# Patient Record
Sex: Male | Born: 1975 | Race: White | Hispanic: No | Marital: Married | Smoking: Never smoker
Health system: Southern US, Community
[De-identification: ages and names within clinical notes are randomized; demographics above are authoritative.]

## PROBLEM LIST (undated history)

## (undated) DIAGNOSIS — R011 Cardiac murmur, unspecified: Secondary | ICD-10-CM

## (undated) HISTORY — PX: COLONOSCOPY: SHX174

## (undated) HISTORY — DX: Cardiac murmur, unspecified: R01.1

---

## 2013-06-29 ENCOUNTER — Ambulatory Visit: Payer: Self-pay | Admitting: Gastroenterology

## 2016-12-07 ENCOUNTER — Ambulatory Visit (INDEPENDENT_AMBULATORY_CARE_PROVIDER_SITE_OTHER): Payer: Commercial Managed Care - PPO | Admitting: Family Medicine

## 2016-12-07 ENCOUNTER — Encounter: Payer: Self-pay | Admitting: Family Medicine

## 2016-12-07 VITALS — BP 140/92 | HR 87 | Temp 98.5°F | Resp 16 | Ht 66.5 in | Wt 272.2 lb

## 2016-12-07 DIAGNOSIS — Z0000 Encounter for general adult medical examination without abnormal findings: Secondary | ICD-10-CM

## 2016-12-07 DIAGNOSIS — R030 Elevated blood-pressure reading, without diagnosis of hypertension: Secondary | ICD-10-CM | POA: Diagnosis not present

## 2016-12-07 DIAGNOSIS — B372 Candidiasis of skin and nail: Secondary | ICD-10-CM | POA: Diagnosis not present

## 2016-12-07 DIAGNOSIS — Z23 Encounter for immunization: Secondary | ICD-10-CM

## 2016-12-07 DIAGNOSIS — Z6841 Body Mass Index (BMI) 40.0 and over, adult: Secondary | ICD-10-CM

## 2016-12-07 DIAGNOSIS — R5381 Other malaise: Secondary | ICD-10-CM

## 2016-12-07 DIAGNOSIS — R5383 Other fatigue: Secondary | ICD-10-CM

## 2016-12-07 NOTE — Progress Notes (Signed)
Patient: Jared Craig Male    DOB: 06/22/1975   41 y.o.   MRN: [id number] Visit Date: 12/07/2016  Today's Provider: Dortha Kern, PA   Chief Complaint  Patient presents with  . New Patient (Initial Visit)   Subjective:    HPI Jared Craig is a 41 year old male who presents today to [facility] as a new patient. He did not have a previous PCP after being released from [employer].   Past Medical History:  Diagnosis Date  . Heart murmur    as a child   Past Surgical History:  Procedure Laterality Date  . COLONOSCOPY     Family History  Problem Relation Age of Onset  . COPD Mother   . Heart attack Father   . Colon cancer Maternal Grandmother   . Heart attack Paternal Grandmother    No Known Allergies   Previous Medications   No medications on file    Review of Systems  Constitutional: Negative.   HENT: Negative.   Eyes: Negative.   Respiratory: Negative.   Cardiovascular: Negative.   Gastrointestinal: Negative.   Endocrine: Negative.   Genitourinary: Negative.   Musculoskeletal: Positive for arthralgias, back pain, neck pain and neck stiffness.  Skin: Positive for rash (under arm pit).  Allergic/Immunologic: Negative.   Neurological: Negative.   Hematological: Negative.   Psychiatric/Behavioral: Negative.     Social History  Substance Use Topics  . Smoking status: Never Smoker  . Smokeless tobacco: Current User    Types: Chew  . Alcohol use Yes     Comment: occasionally    Objective:   BP (!) 140/92 (BP Location: Right Arm, Patient Position: Sitting, Cuff Size: Large)   Pulse 87   Temp 98.5 F (36.9 C) (Oral)   Resp 16   Ht 5' 6.5" (1.689 m)   Wt 272 lb 3.2 oz (123.5 kg)   SpO2 94%   BMI 43.28 kg/m   Physical Exam  Constitutional: He is oriented to person, place, and time. He appears well-developed and well-nourished.  HENT:  Head: Normocephalic and atraumatic.  Right Ear: External ear normal.  Left Ear: External ear normal.    Nose: Nose normal.  Mouth/Throat: Oropharynx is clear and moist.  Eyes: Pupils are equal, round, and reactive to light. Conjunctivae and EOM are normal. Right eye exhibits no discharge.  Neck: Normal range of motion. Neck supple. No tracheal deviation present. No thyromegaly present.  Cardiovascular: Normal rate, regular rhythm, normal heart sounds and intact distal pulses.   No murmur heard. Pulmonary/Chest: Effort normal and breath sounds normal. No respiratory distress. He has no wheezes. He has no rales. He exhibits no tenderness.  Abdominal: Soft. Bowel sounds are normal. He exhibits no distension and no mass. There is no tenderness. There is no rebound and no guarding.  Genitourinary: Rectum normal, prostate normal and penis normal. Rectal exam shows guaiac negative stool.  Musculoskeletal: Normal range of motion. He exhibits no edema or tenderness.  Lymphadenopathy:    He has no cervical adenopathy.  Neurological: He is alert and oriented to person, place, and time. He has normal reflexes. No cranial nerve deficit. He exhibits normal muscle tone. Coordination normal.  Skin: Skin is warm and dry. Rash noted.  Whitish wet rash with surrounding erythema in skin fold of the left axilla at the anterior shoulder.  Psychiatric: He has a normal mood and affect. His behavior is normal. Judgment and thought content normal.      Assessment & Plan:  1. Annual physical exam General health stable except need for weight loss. Family history positive for colon cancer in maternal grandmother and polyps in mother. He has had colonoscopy 2005 and 2015 to screen for cancer. No signs of cancer on these exams. Will give Tdap up date. Remainder of immunizations were up dated while in [employer]. Given anticipatory guidance.  2. Monilial intertrigo Yeast infection in the folds of skin at the right anterior axilla. May use Lotrimin and Hydrocortisone cream mixed equal parts to clear itching and  rash. Use Zeasorb-AF to keep the area as dry as possible to prevent recurrences. Weight loss will also help.  3. Elevated BP without diagnosis of hypertension BP 140/92 today. Denies chest pains, peripheral edema, dyspnea or palpitations. Recommend reduced sodium intake, decaffeinate beverages and stop all tobacco use. Check CBC, CMP, Lipid panel and TSH. - CBC with Differential/Platelet - Comprehensive metabolic panel - Lipid panel - TSH  4. Class 3 severe obesity due to excess calories without serious comorbidity with body mass index (BMI) of 40.0 to 44.9 in adult [facility]) Recommend low fat reduced calorie diet and regular exercise to lose weight.  - CBC with Differential/Platelet - Comprehensive metabolic panel - Lipid panel - TSH - Hemoglobin A1c  5. Malaise and fatigue With Class III obesity, will need to check labs and rule out metabolic disorders. Denies hematemesis, hematuria, hematochezia, palpitations, chest pains or polyuria with polydipsia. - CBC with Differential/Platelet - Comprehensive metabolic panel - TSH - Hemoglobin A1c  6. Need for Tdap vaccination - Tdap vaccine greater than or equal to 7yo IM

## 2016-12-07 NOTE — Patient Instructions (Signed)

## 2016-12-08 LAB — CBC WITH DIFFERENTIAL/PLATELET
BASOS PCT: 0.6 %
Basophils Absolute: 61 cells/uL (ref 0–200)
EOS ABS: 293 {cells}/uL (ref 15–500)
Eosinophils Relative: 2.9 %
HEMATOCRIT: 43.4 % (ref 38.5–50.0)
Hemoglobin: 14.9 g/dL (ref 13.2–17.1)
LYMPHS ABS: 3161 {cells}/uL (ref 850–3900)
MCH: 28.8 pg (ref 27.0–33.0)
MCHC: 34.3 g/dL (ref 32.0–36.0)
MCV: 83.9 fL (ref 80.0–100.0)
MPV: 10.1 fL (ref 7.5–12.5)
Monocytes Relative: 5.5 %
Neutro Abs: 6030 cells/uL (ref 1500–7800)
Neutrophils Relative %: 59.7 %
PLATELETS: 325 10*3/uL (ref 140–400)
RBC: 5.17 10*6/uL (ref 4.20–5.80)
RDW: 13.2 % (ref 11.0–15.0)
TOTAL LYMPHOCYTE: 31.3 %
WBC: 10.1 10*3/uL (ref 3.8–10.8)
WBCMIX: 556 {cells}/uL (ref 200–950)

## 2016-12-08 LAB — COMPREHENSIVE METABOLIC PANEL
AG RATIO: 1.6 (calc) (ref 1.0–2.5)
ALBUMIN MSPROF: 4.4 g/dL (ref 3.6–5.1)
ALKALINE PHOSPHATASE (APISO): 57 U/L (ref 40–115)
ALT: 44 U/L (ref 9–46)
AST: 25 U/L (ref 10–40)
BILIRUBIN TOTAL: 0.4 mg/dL (ref 0.2–1.2)
BUN: 18 mg/dL (ref 7–25)
CHLORIDE: 101 mmol/L (ref 98–110)
CO2: 28 mmol/L (ref 20–32)
CREATININE: 1.07 mg/dL (ref 0.60–1.35)
Calcium: 9.7 mg/dL (ref 8.6–10.3)
GLOBULIN: 2.7 g/dL (ref 1.9–3.7)
Glucose, Bld: 100 mg/dL — ABNORMAL HIGH (ref 65–99)
POTASSIUM: 4.6 mmol/L (ref 3.5–5.3)
SODIUM: 137 mmol/L (ref 135–146)
Total Protein: 7.1 g/dL (ref 6.1–8.1)

## 2016-12-08 LAB — TSH: TSH: 2.67 m[IU]/L (ref 0.40–4.50)

## 2016-12-08 LAB — HEMOGLOBIN A1C
Hgb A1c MFr Bld: 6 % of total Hgb — ABNORMAL HIGH (ref <5.7)
Mean Plasma Glucose: 126 (calc)
eAG (mmol/L): 7 (calc)

## 2016-12-08 LAB — LIPID PANEL
CHOLESTEROL: 230 mg/dL — AB (ref <200)
HDL: 42 mg/dL (ref >40)
LDL CHOLESTEROL (CALC): 156 mg/dL — AB
Non-HDL Cholesterol (Calc): 188 mg/dL (calc) — ABNORMAL HIGH (ref <130)
Total CHOL/HDL Ratio: 5.5 (calc) — ABNORMAL HIGH (ref <5.0)
Triglycerides: 183 mg/dL — ABNORMAL HIGH (ref <150)

## 2016-12-10 ENCOUNTER — Telehealth: Payer: Self-pay

## 2016-12-10 NOTE — Telephone Encounter (Signed)
Patient advised.KW 

## 2016-12-10 NOTE — Telephone Encounter (Signed)
-----   Message from Jodell Cipro Alden, [place] sent at 12/08/2016  5:44 PM EDT ----- Blood tests essentially normal except Cholesterol, Triglycerides and LDL levels are high. Glucose essentially normal but Hgb A1C is in the pre-diabetes range. Low fat diet, weight loss and regular exercise can help both pre-diabetes and high cholesterol. May add Krill Oil qd with Red Yeast Rice one daily and recheck progress in 3 months.

## 2017-03-22 DIAGNOSIS — E669 Obesity, unspecified: Secondary | ICD-10-CM | POA: Insufficient documentation

## 2017-03-22 DIAGNOSIS — E782 Mixed hyperlipidemia: Secondary | ICD-10-CM | POA: Insufficient documentation

## 2017-03-22 DIAGNOSIS — R7309 Other abnormal glucose: Secondary | ICD-10-CM | POA: Insufficient documentation

## 2017-03-23 ENCOUNTER — Encounter: Payer: Self-pay | Admitting: Family Medicine

## 2017-03-23 ENCOUNTER — Ambulatory Visit: Payer: Commercial Managed Care - PPO | Admitting: Family Medicine

## 2017-03-23 VITALS — BP 130/92 | HR 80 | Temp 98.3°F | Wt 278.4 lb

## 2017-03-23 DIAGNOSIS — Z114 Encounter for screening for human immunodeficiency virus [HIV]: Secondary | ICD-10-CM | POA: Diagnosis not present

## 2017-03-23 DIAGNOSIS — R7309 Other abnormal glucose: Secondary | ICD-10-CM | POA: Diagnosis not present

## 2017-03-23 DIAGNOSIS — E782 Mixed hyperlipidemia: Secondary | ICD-10-CM | POA: Diagnosis not present

## 2017-03-23 NOTE — Progress Notes (Signed)
Patient: Jared Craig Male    DOB: 20-Aug-1975   42 y.o.   MRN: [id number] Visit Date: 03/23/2017  Today's Provider: Dortha Kernennis Hope Holst, PA   Chief Complaint  Patient presents with  . Hyperlipidemia  . elevated A1C  . Follow-up   Subjective:    HPI  Lipid/Cholesterol, Follow-up:   Last seen for this 3 months ago.  Management changes since that visit include advised to exercise, loss weight, take [place] Oil and Red Yeast Rice. . Last Lipid Panel:    Component Value Date/Time   CHOL 230 (H) 12/07/2016 1051   TRIG 183 (H) 12/07/2016 1051   HDL 42 12/07/2016 1051   CHOLHDL 5.5 (H) 12/07/2016 1051    Risk factors for vascular disease include pre diabetic  He reports poor compliance with treatment. He states he forgot about treatment recommendations He is not having side effects.  Current symptoms include none Weight trend: stable Prior visit with dietician: no Current diet: in general, an "unhealthy" diet Current exercise: none  Wt Readings from Last 3 Encounters:  03/23/17 278 lb 6.4 oz (126.3 kg)  12/07/16 272 lb 3.2 oz (123.5 kg)    ------------------------------------------------------------------- Labs done on 12/07/2016 showed elevated Hgb A1C at 6.0% (pre-diabetic range). Patient advised to exercise and work on weight loss. Patient reports poor compliance with treatment plan. He states he forgot about treatment recommendations. He is not experiencing any symptoms at this time.   Lab Results  Component Value Date   HGBA1C 6.0 (H) 12/07/2016   Past Medical History:  Diagnosis Date  . Heart murmur    as a child   Past Surgical History:  Procedure Laterality Date  . COLONOSCOPY     Family History  Problem Relation Age of Onset  . COPD Mother   . Heart attack Father   . Colon cancer Maternal Grandmother   . Heart attack Paternal Grandmother    No Known Allergies  No current outpatient medications on file.  Review of Systems  Constitutional:  Negative.   Respiratory: Negative.   Cardiovascular: Negative.    Social History   Tobacco Use  . Smoking status: Never Smoker  . Smokeless tobacco: Current User    Types: Chew  Substance Use Topics  . Alcohol use: Yes    Comment: occasionally    Objective:   BP (!) 130/92 (BP Location: Right Arm, Patient Position: Sitting, Cuff Size: Large)   Pulse 80   Temp 98.3 F (36.8 C) (Oral)   Wt 278 lb 6.4 oz (126.3 kg)   SpO2 98%   BMI 44.26 kg/m  BP Readings from Last 3 Encounters:  03/23/17 (!) 130/92  12/07/16 (!) 140/92   Physical Exam  Constitutional: He is oriented to person, place, and time. He appears well-developed and well-nourished. No distress.  HENT:  Head: Normocephalic and atraumatic.  Right Ear: Hearing normal.  Left Ear: Hearing normal.  Nose: Nose normal.  Eyes: Conjunctivae and lids are normal. Right eye exhibits no discharge. Left eye exhibits no discharge. No scleral icterus.  Neck: Neck supple.  Cardiovascular: Normal rate and regular rhythm.  Pulmonary/Chest: Effort normal and breath sounds normal. No respiratory distress.  Abdominal: Soft. Bowel sounds are normal.  Musculoskeletal: Normal range of motion.  Neurological: He is alert and oriented to person, place, and time.  Skin: Skin is intact. No lesion and no rash noted.  Psychiatric: He has a normal mood and affect. His speech is normal and behavior is normal.  Thought content normal.      Assessment & Plan:      1. Elevated hemoglobin A1c No polyuria, polydipsia or polyphagia. Has gained 6 lbs during the holidays. Encouraged to work on diet and weight loss. Will recheck CMP and Hgb A1C to be sure he is not over the line and into diabetes now. Follow up pending reports. - Comprehensive metabolic panel - Hemoglobin A1c  2. Elevated cholesterol with elevated triglycerides Forgot to take the Red Yeast Rice and Krill Oil as advised 3 months ago. Has gained 6 more lbs. Encouraged to work on diet and  exercise with trying these supplements. May need to consider statin if no improvement with this regimen. - Comprehensive metabolic panel - Lipid panel  3. Screening for HIV (human immunodeficiency virus) - HIV antibody       Dortha Kern, PA  [facility]

## 2017-03-24 LAB — COMPREHENSIVE METABOLIC PANEL
ALT: 40 IU/L (ref 0–44)
AST: 22 IU/L (ref 0–40)
Albumin/Globulin Ratio: 1.8 (ref 1.2–2.2)
Albumin: 4.6 g/dL (ref 3.5–5.5)
Alkaline Phosphatase: 58 IU/L (ref 39–117)
BUN/Creatinine Ratio: 15 (ref 9–20)
BUN: 16 mg/dL (ref 6–24)
Bilirubin Total: 0.3 mg/dL (ref 0.0–1.2)
CALCIUM: 9.4 mg/dL (ref 8.7–10.2)
CO2: 25 mmol/L (ref 20–29)
CREATININE: 1.07 mg/dL (ref 0.76–1.27)
Chloride: 100 mmol/L (ref 96–106)
GFR calc Af Amer: 98 mL/min/{1.73_m2} (ref >59)
GFR calc non Af Amer: 85 mL/min/{1.73_m2} (ref >59)
GLUCOSE: 98 mg/dL (ref 65–99)
Globulin, Total: 2.5 g/dL (ref 1.5–4.5)
POTASSIUM: 4.5 mmol/L (ref 3.5–5.2)
Sodium: 140 mmol/L (ref 134–144)
Total Protein: 7.1 g/dL (ref 6.0–8.5)

## 2017-03-24 LAB — LIPID PANEL
CHOL/HDL RATIO: 5.5 ratio — AB (ref 0.0–5.0)
Cholesterol, Total: 224 mg/dL — ABNORMAL HIGH (ref 100–199)
HDL: 41 mg/dL (ref >39)
LDL Calculated: 142 mg/dL — ABNORMAL HIGH (ref 0–99)
TRIGLYCERIDES: 203 mg/dL — AB (ref 0–149)
VLDL Cholesterol Cal: 41 mg/dL — ABNORMAL HIGH (ref 5–40)

## 2017-03-24 LAB — HIV ANTIBODY (ROUTINE TESTING W REFLEX): HIV SCREEN 4TH GENERATION: NONREACTIVE

## 2017-03-24 LAB — HEMOGLOBIN A1C
Est. average glucose Bld gHb Est-mCnc: 134 mg/dL
Hgb A1c MFr Bld: 6.3 % — ABNORMAL HIGH (ref 4.8–5.6)

## 2017-03-25 ENCOUNTER — Telehealth: Payer: Self-pay

## 2017-03-25 NOTE — Telephone Encounter (Signed)
Patient advised. 3 month follow up scheduled.  

## 2017-03-25 NOTE — Telephone Encounter (Signed)
-----   Message from Tamsen Roersennis E Chrismon, [place] sent at 03/25/2017  8:13 AM EST ----- Cholesterol and triglycerides higher than last check. Also, Hgb A1C higher (close to >6.4 diabetic range now). Must lose weight and follow low fat diet with cholesterol treatment as recommended. Recheck levels in 3 months.

## 2017-06-24 ENCOUNTER — Ambulatory Visit: Payer: Commercial Managed Care - PPO | Admitting: Family Medicine

## 2017-06-24 ENCOUNTER — Encounter: Payer: Self-pay | Admitting: Family Medicine

## 2017-06-24 VITALS — BP 144/88 | HR 104 | Temp 98.4°F | Wt 270.2 lb

## 2017-06-24 DIAGNOSIS — E782 Mixed hyperlipidemia: Secondary | ICD-10-CM

## 2017-06-24 DIAGNOSIS — S46911A Strain of unspecified muscle, fascia and tendon at shoulder and upper arm level, right arm, initial encounter: Secondary | ICD-10-CM

## 2017-06-24 DIAGNOSIS — R7309 Other abnormal glucose: Secondary | ICD-10-CM | POA: Diagnosis not present

## 2017-06-24 LAB — POCT GLYCOSYLATED HEMOGLOBIN (HGB A1C): HEMOGLOBIN A1C: 6.2

## 2017-06-24 NOTE — Patient Instructions (Signed)

## 2017-06-24 NOTE — Progress Notes (Signed)
Patient: Jared Craig Male    DOB: 1975-10-11   42 y.o.   MRN: [id number] Visit Date: 06/24/2017  Today's Provider: Dortha Kernennis Chrismon, PA   Chief Complaint  Patient presents with  . Hyperlipidemia  . Elevated A1C  . Follow-up   Subjective:    HPI  Lipid/Cholesterol, Follow-up:   Last seen for this 3 months ago.  Management changes since that visit include advised to exercise, diet, loss weight, take Krill Oil and Red Yeast Rice. . Last Lipid Panel: Labs(Brief)   Lab Results  Component Value Date   CHOL 224 (H) 03/23/2017   HDL 41 03/23/2017   LDLCALC 142 (H) 03/23/2017   TRIG 203 (H) 03/23/2017   CHOLHDL 5.5 (H) 03/23/2017    Risk factors for vascular disease include pre-diabetic  He reports fair compliance with treatment.  He is not having side effects.  Current symptoms include none Weight trend: stable Prior visit with dietician: no Current diet: limited drinking sodas to 1-2 a week Current exercise: lightly     Wt Readings from Last 3 Encounters:  03/23/17 278 lb 6.4 oz (126.3 kg)  12/07/16 272 lb 3.2 oz (123.5 kg)   Elevated A1C: Labs done on 03/23/17 showed elevated Hgb A1C at 6.3% (pre-diabetic range). Patient advised to exercise,diet and work on weight loss. Patient reports fair compliance with treatment plan. He is not experiencing any symptoms at this time   No Known Allergies   Current Outpatient Medications:  .  KRILL OIL PO, Take by mouth., Disp: , Rfl:  .  Red Yeast Rice Extract (RED YEAST RICE PO), Take by mouth., Disp: , Rfl:   Review of Systems  Constitutional: Negative.   Respiratory: Negative.   Cardiovascular: Negative.   Endocrine: Negative.   Musculoskeletal: Negative.     Social History   Tobacco Use  . Smoking status: Never Smoker  . Smokeless tobacco: Current User    Types: Chew  Substance Use Topics  . Alcohol use: Yes    Comment: occasionally    Objective:   BP (!) 144/88 (BP Location: Right Arm,  Patient Position: Sitting, Cuff Size: Normal)   Pulse (!) 104   Temp 98.4 F (36.9 C) (Oral)   Wt 270 lb 3.2 oz (122.6 kg)   SpO2 97%   BMI 42.96 kg/m    Physical Exam  Constitutional: He is oriented to person, place, and time. He appears well-developed and well-nourished. No distress.  HENT:  Head: Normocephalic and atraumatic.  Right Ear: Hearing normal.  Left Ear: Hearing normal.  Nose: Nose normal.  Eyes: Conjunctivae and lids are normal. Right eye exhibits no discharge. Left eye exhibits no discharge. No scleral icterus.  Neck: Neck supple. No thyromegaly present.  Cardiovascular: Regular rhythm and normal heart sounds.  Pulmonary/Chest: Effort normal. No respiratory distress.  Abdominal: Soft. Bowel sounds are normal.  Musculoskeletal: Normal range of motion.  Neurological: He is alert and oriented to person, place, and time.  Skin: Skin is intact. No lesion and no rash noted.  Psychiatric: He has a normal mood and affect. His speech is normal and behavior is normal. Thought content normal.      Assessment & Plan:     1. Elevated hemoglobin A1c Hgb A1C was 6.3% three months ago - 6.2% today. No polyuria, vision changes or polydipsia. Has lost 8 lbs in the past 3 month with regular exercise and lowering caloric intake. Continue present regimen and recheck labs. BP borderline high. Recommend he  limit salt intake and check BP away from this office (states he gets a little "nervous" when he comes here). Follow up pending lab reports. - POCT HgB A1C - CBC with Differential/Platelet - Comprehensive metabolic panel - Lipid panel  2. Elevated cholesterol with elevated triglycerides Has lost 8 lbs and exercising by playing frizbee golf. Last total cholesterol was 225, triglycerides 203, HDL 41 and LDL 142 three months ago. Will recheck labs and encouraged to continue exercise and weight loss. - Comprehensive metabolic panel - Lipid panel  3. Strain of right shoulder, initial  encounter Larey Seat backward and tried to reach behind himself to stop the fall as he sat down hard a couple months ago. 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 Has good ROM today. Given ROM exercises to use with PT exercises he had used in the past. Continue NSAID of choice and recheck prn. Limit repetitive overhead work.       Dortha Kern, PA  [facility]

## 2019-06-18 ENCOUNTER — Encounter: Payer: Self-pay | Admitting: Emergency Medicine

## 2019-06-18 ENCOUNTER — Other Ambulatory Visit: Payer: Self-pay

## 2019-06-18 ENCOUNTER — Emergency Department: Payer: Commercial Managed Care - PPO

## 2019-06-18 ENCOUNTER — Inpatient Hospital Stay
Admission: EM | Admit: 2019-06-18 | Discharge: 2019-06-23 | DRG: 177 | Disposition: A | Discharge status: Home / Self Care | Payer: Commercial Managed Care - PPO | Attending: Internal Medicine | Admitting: Internal Medicine

## 2019-06-18 DIAGNOSIS — Z825 Family history of asthma and other chronic lower respiratory diseases: Secondary | ICD-10-CM

## 2019-06-18 DIAGNOSIS — E785 Hyperlipidemia, unspecified: Secondary | ICD-10-CM | POA: Diagnosis present

## 2019-06-18 DIAGNOSIS — Z800 Family history of malignant neoplasm of digestive organs: Secondary | ICD-10-CM

## 2019-06-18 DIAGNOSIS — J1282 Pneumonia due to coronavirus disease 2019: Secondary | ICD-10-CM

## 2019-06-18 DIAGNOSIS — U071 COVID-19: Secondary | ICD-10-CM | POA: Diagnosis not present

## 2019-06-18 DIAGNOSIS — J9601 Acute respiratory failure with hypoxia: Secondary | ICD-10-CM | POA: Diagnosis present

## 2019-06-18 DIAGNOSIS — Z8249 Family history of ischemic heart disease and other diseases of the circulatory system: Secondary | ICD-10-CM

## 2019-06-18 DIAGNOSIS — E878 Other disorders of electrolyte and fluid balance, not elsewhere classified: Secondary | ICD-10-CM | POA: Diagnosis present

## 2019-06-18 DIAGNOSIS — R0602 Shortness of breath: Secondary | ICD-10-CM | POA: Diagnosis not present

## 2019-06-18 DIAGNOSIS — E869 Volume depletion, unspecified: Secondary | ICD-10-CM | POA: Diagnosis present

## 2019-06-18 DIAGNOSIS — Z6841 Body Mass Index (BMI) 40.0 and over, adult: Secondary | ICD-10-CM

## 2019-06-18 DIAGNOSIS — E871 Hypo-osmolality and hyponatremia: Secondary | ICD-10-CM | POA: Diagnosis present

## 2019-06-18 LAB — CBC WITH DIFFERENTIAL/PLATELET
Abs Immature Granulocytes: 0.04 10*3/uL (ref 0.00–0.07)
Basophils Absolute: 0 10*3/uL (ref 0.0–0.1)
Basophils Relative: 0 %
Eosinophils Absolute: 0 10*3/uL (ref 0.0–0.5)
Eosinophils Relative: 0 %
HCT: 46 % (ref 39.0–52.0)
Hemoglobin: 15.4 g/dL (ref 13.0–17.0)
Immature Granulocytes: 1 %
Lymphocytes Relative: 30 %
Lymphs Abs: 1.9 10*3/uL (ref 0.7–4.0)
MCH: 28.7 pg (ref 26.0–34.0)
MCHC: 33.5 g/dL (ref 30.0–36.0)
MCV: 85.7 fL (ref 80.0–100.0)
Monocytes Absolute: 0.4 10*3/uL (ref 0.1–1.0)
Monocytes Relative: 7 %
Neutro Abs: 4 10*3/uL (ref 1.7–7.7)
Neutrophils Relative %: 62 %
Platelets: 243 10*3/uL (ref 150–400)
RBC: 5.37 MIL/uL (ref 4.22–5.81)
RDW: 12.6 % (ref 11.5–15.5)
WBC: 6.4 10*3/uL (ref 4.0–10.5)
nRBC: 0 % (ref 0.0–0.2)

## 2019-06-18 LAB — COMPREHENSIVE METABOLIC PANEL
ALT: 55 U/L — ABNORMAL HIGH (ref 0–44)
AST: 64 U/L — ABNORMAL HIGH (ref 15–41)
Albumin: 3.6 g/dL (ref 3.5–5.0)
Alkaline Phosphatase: 45 U/L (ref 38–126)
Anion gap: 12 (ref 5–15)
BUN: 12 mg/dL (ref 6–20)
CO2: 24 mmol/L (ref 22–32)
Calcium: 8.8 mg/dL — ABNORMAL LOW (ref 8.9–10.3)
Chloride: 97 mmol/L — ABNORMAL LOW (ref 98–111)
Creatinine, Ser: 1.01 mg/dL (ref 0.61–1.24)
GFR calc Af Amer: >60 mL/min (ref >60)
GFR calc non Af Amer: >60 mL/min (ref >60)
Glucose, Bld: 124 mg/dL — ABNORMAL HIGH (ref 70–99)
Potassium: 3.8 mmol/L (ref 3.5–5.1)
Sodium: 133 mmol/L — ABNORMAL LOW (ref 135–145)
Total Bilirubin: 0.5 mg/dL (ref 0.3–1.2)
Total Protein: 7.7 g/dL (ref 6.5–8.1)

## 2019-06-18 LAB — TROPONIN I (HIGH SENSITIVITY): Troponin I (High Sensitivity): 12 ng/L (ref <18)

## 2019-06-18 MED ORDER — ACETAMINOPHEN 500 MG PO TABS
1000.0000 mg | ORAL_TABLET | Freq: Once | ORAL | Status: AC
  Administered 2019-06-19: 01:00:00 1000 mg via ORAL
  Filled 2019-06-18: qty 2

## 2019-06-18 NOTE — ED Triage Notes (Signed)
Patient ambulatory to triage with complaints of COVID positive on last Monday, with increasing fever, lower back and neck aches, sore throat, "coughing to the point of blacking out" once, arms numb, congestion without cough production  Last tylenol 5 pm   Speaking in complete coherent sentences. No acute breathing distress noted.

## 2019-06-19 ENCOUNTER — Emergency Department: Payer: Commercial Managed Care - PPO

## 2019-06-19 ENCOUNTER — Encounter: Payer: Self-pay | Admitting: Radiology

## 2019-06-19 DIAGNOSIS — J1282 Pneumonia due to coronavirus disease 2019: Secondary | ICD-10-CM | POA: Diagnosis present

## 2019-06-19 DIAGNOSIS — R0602 Shortness of breath: Secondary | ICD-10-CM | POA: Diagnosis present

## 2019-06-19 DIAGNOSIS — E878 Other disorders of electrolyte and fluid balance, not elsewhere classified: Secondary | ICD-10-CM | POA: Diagnosis present

## 2019-06-19 DIAGNOSIS — U071 COVID-19: Secondary | ICD-10-CM | POA: Diagnosis present

## 2019-06-19 DIAGNOSIS — E869 Volume depletion, unspecified: Secondary | ICD-10-CM | POA: Diagnosis present

## 2019-06-19 DIAGNOSIS — Z6841 Body Mass Index (BMI) 40.0 and over, adult: Secondary | ICD-10-CM | POA: Diagnosis not present

## 2019-06-19 DIAGNOSIS — Z800 Family history of malignant neoplasm of digestive organs: Secondary | ICD-10-CM | POA: Diagnosis not present

## 2019-06-19 DIAGNOSIS — E785 Hyperlipidemia, unspecified: Secondary | ICD-10-CM | POA: Diagnosis present

## 2019-06-19 DIAGNOSIS — Z825 Family history of asthma and other chronic lower respiratory diseases: Secondary | ICD-10-CM | POA: Diagnosis not present

## 2019-06-19 DIAGNOSIS — J9601 Acute respiratory failure with hypoxia: Secondary | ICD-10-CM | POA: Diagnosis present

## 2019-06-19 DIAGNOSIS — E871 Hypo-osmolality and hyponatremia: Secondary | ICD-10-CM

## 2019-06-19 DIAGNOSIS — R7989 Other specified abnormal findings of blood chemistry: Secondary | ICD-10-CM | POA: Diagnosis not present

## 2019-06-19 DIAGNOSIS — Z8249 Family history of ischemic heart disease and other diseases of the circulatory system: Secondary | ICD-10-CM | POA: Diagnosis not present

## 2019-06-19 LAB — TROPONIN I (HIGH SENSITIVITY)
Troponin I (High Sensitivity): 12 ng/L (ref <18)
Troponin I (High Sensitivity): 8 ng/L (ref <18)

## 2019-06-19 LAB — LACTATE DEHYDROGENASE: LDH: 354 U/L — ABNORMAL HIGH (ref 98–192)

## 2019-06-19 LAB — URINALYSIS, COMPLETE (UACMP) WITH MICROSCOPIC
Bacteria, UA: NONE SEEN
Bilirubin Urine: NEGATIVE
Glucose, UA: NEGATIVE mg/dL
Ketones, ur: NEGATIVE mg/dL
Leukocytes,Ua: NEGATIVE
Nitrite: NEGATIVE
Protein, ur: 100 mg/dL — AB
Specific Gravity, Urine: 1.016 (ref 1.005–1.030)
pH: 5 (ref 5.0–8.0)

## 2019-06-19 LAB — POC SARS CORONAVIRUS 2 AG: SARS Coronavirus 2 Ag: NEGATIVE

## 2019-06-19 LAB — FIBRINOGEN: Fibrinogen: 651 mg/dL — ABNORMAL HIGH (ref 210–475)

## 2019-06-19 LAB — FERRITIN: Ferritin: 1030 ng/mL — ABNORMAL HIGH (ref 24–336)

## 2019-06-19 LAB — PROCALCITONIN: Procalcitonin: 0.2 ng/mL

## 2019-06-19 LAB — FIBRIN DERIVATIVES D-DIMER (ARMC ONLY): Fibrin derivatives D-dimer (ARMC): 1052.48 ng/mL (FEU) — ABNORMAL HIGH (ref 0.00–499.00)

## 2019-06-19 LAB — ABO/RH: ABO/RH(D): A POS

## 2019-06-19 LAB — C-REACTIVE PROTEIN: CRP: 11.8 mg/dL — ABNORMAL HIGH (ref <1.0)

## 2019-06-19 LAB — HIV ANTIBODY (ROUTINE TESTING W REFLEX): HIV Screen 4th Generation wRfx: NONREACTIVE

## 2019-06-19 MED ORDER — SODIUM CHLORIDE 0.9 % IV SOLN
200.0000 mg | Freq: Once | INTRAVENOUS | Status: DC
  Filled 2019-06-19: qty 40

## 2019-06-19 MED ORDER — SODIUM CHLORIDE 0.9 % IV SOLN
200.0000 mg | Freq: Once | INTRAVENOUS | Status: AC
  Administered 2019-06-19: 06:00:00 200 mg via INTRAVENOUS
  Filled 2019-06-19: qty 40

## 2019-06-19 MED ORDER — SODIUM CHLORIDE 0.9 % IV SOLN
100.0000 mg | Freq: Every day | INTRAVENOUS | Status: AC
  Administered 2019-06-20 – 2019-06-23 (×4): 100 mg via INTRAVENOUS
  Filled 2019-06-19: qty 20
  Filled 2019-06-19: qty 100
  Filled 2019-06-19: qty 20
  Filled 2019-06-19: qty 100

## 2019-06-19 MED ORDER — DEXAMETHASONE SODIUM PHOSPHATE 10 MG/ML IJ SOLN
6.0000 mg | INTRAMUSCULAR | Status: DC
  Administered 2019-06-20 – 2019-06-23 (×4): 6 mg via INTRAVENOUS
  Filled 2019-06-19 (×4): qty 1

## 2019-06-19 MED ORDER — TRAZODONE HCL 50 MG PO TABS
25.0000 mg | ORAL_TABLET | Freq: Every evening | ORAL | Status: DC | PRN
  Administered 2019-06-19 – 2019-06-22 (×4): 25 mg via ORAL
  Filled 2019-06-19 (×4): qty 1

## 2019-06-19 MED ORDER — MAGNESIUM HYDROXIDE 400 MG/5ML PO SUSP
30.0000 mL | Freq: Every day | ORAL | Status: DC | PRN
  Filled 2019-06-19: qty 30

## 2019-06-19 MED ORDER — FAMOTIDINE 20 MG PO TABS
20.0000 mg | ORAL_TABLET | Freq: Two times a day (BID) | ORAL | Status: DC
  Administered 2019-06-19 – 2019-06-23 (×9): 20 mg via ORAL
  Filled 2019-06-19 (×10): qty 1

## 2019-06-19 MED ORDER — SODIUM CHLORIDE 0.9 % IV SOLN: 100.0000 mg | Freq: Every day | INTRAVENOUS | Status: DC

## 2019-06-19 MED ORDER — ENOXAPARIN SODIUM 40 MG/0.4ML ~~LOC~~ SOLN
40.0000 mg | Freq: Two times a day (BID) | SUBCUTANEOUS | Status: DC
  Administered 2019-06-19 – 2019-06-23 (×9): 40 mg via SUBCUTANEOUS
  Filled 2019-06-19 (×11): qty 0.4

## 2019-06-19 MED ORDER — HYDROCOD POLST-CPM POLST ER 10-8 MG/5ML PO SUER
5.0000 mL | Freq: Two times a day (BID) | ORAL | Status: DC | PRN
  Administered 2019-06-19 – 2019-06-21 (×2): 5 mL via ORAL
  Filled 2019-06-19 (×2): qty 5

## 2019-06-19 MED ORDER — ASPIRIN EC 81 MG PO TBEC
81.0000 mg | DELAYED_RELEASE_TABLET | Freq: Every day | ORAL | Status: DC
  Administered 2019-06-19 – 2019-06-23 (×5): 81 mg via ORAL
  Filled 2019-06-19 (×5): qty 1

## 2019-06-19 MED ORDER — ACETAMINOPHEN 325 MG PO TABS: 650.0000 mg | ORAL_TABLET | Freq: Four times a day (QID) | ORAL | Status: DC | PRN

## 2019-06-19 MED ORDER — ZINC SULFATE 220 (50 ZN) MG PO CAPS
220.0000 mg | ORAL_CAPSULE | Freq: Every day | ORAL | Status: DC
  Administered 2019-06-19 – 2019-06-23 (×5): 220 mg via ORAL
  Filled 2019-06-19 (×5): qty 1

## 2019-06-19 MED ORDER — DEXAMETHASONE SODIUM PHOSPHATE 10 MG/ML IJ SOLN
6.0000 mg | Freq: Once | INTRAMUSCULAR | Status: AC
  Administered 2019-06-19: 01:00:00 6 mg via INTRAVENOUS
  Filled 2019-06-19: qty 1

## 2019-06-19 MED ORDER — GUAIFENESIN-DM 100-10 MG/5ML PO SYRP
10.0000 mL | ORAL_SOLUTION | ORAL | Status: DC | PRN
  Administered 2019-06-20 – 2019-06-23 (×3): 10 mL via ORAL
  Filled 2019-06-19 (×4): qty 10

## 2019-06-19 MED ORDER — SODIUM CHLORIDE 0.9 % IV SOLN: INTRAVENOUS | Status: DC

## 2019-06-19 MED ORDER — ASCORBIC ACID 500 MG PO TABS
500.0000 mg | ORAL_TABLET | Freq: Every day | ORAL | Status: DC
  Administered 2019-06-19 – 2019-06-23 (×5): 500 mg via ORAL
  Filled 2019-06-19 (×5): qty 1

## 2019-06-19 MED ORDER — GUAIFENESIN ER 600 MG PO TB12
600.0000 mg | ORAL_TABLET | Freq: Two times a day (BID) | ORAL | Status: DC
  Administered 2019-06-19 – 2019-06-23 (×9): 600 mg via ORAL
  Filled 2019-06-19 (×11): qty 1

## 2019-06-19 MED ORDER — IOHEXOL 350 MG/ML SOLN
75.0000 mL | Freq: Once | INTRAVENOUS | Status: AC | PRN
  Administered 2019-06-19: 01:00:00 75 mL via INTRAVENOUS

## 2019-06-19 MED ORDER — VITAMIN D 25 MCG (1000 UNIT) PO TABS
1000.0000 [IU] | ORAL_TABLET | Freq: Every day | ORAL | Status: DC
  Administered 2019-06-19 – 2019-06-23 (×4): 1000 [IU] via ORAL
  Filled 2019-06-19 (×4): qty 1

## 2019-06-19 NOTE — ED Notes (Signed)
PT transported to CT>

## 2019-06-19 NOTE — ED Notes (Signed)
Pt ambulated in room. Oxygen saturations drop to 93% with good waveform.

## 2019-06-19 NOTE — H&P (Signed)
[place] [facility]   PATIENT NAME: Jared Craig    MR#:  [id number]  DATE OF BIRTH:  [birth year]  DATE OF ADMISSION:  06/18/2019  PRIMARY CARE PHYSICIAN: Chrismon, Jodell Cipro, PA   REQUESTING/REFERRING PHYSICIAN: Cecil Cobbs, MD  CHIEF COMPLAINT:   Chief Complaint  Patient presents with   Cough   Back Pain   Fever   Sore Throat    HISTORY OF PRESENT ILLNESS:  Jared Craig  is a 44 y.o. Caucasian male with a known history of severe obesity, presented to the emergency room with an onset of fever with associated  body aches and cough that started about 4 days ago.  He tested positive for COVID-19 on Tuesday after being exposed to his brother who also tested positive.  His symptoms started on Wednesday.  Earlier this morning he complained of dyspnea and chest pain felt as numbness with radiation to his arms without  palpitations.  No nausea or vomiting but he has been having diarrhea with no abdominal pain. He admitted to loss of taste and smell as well as generalized weakness and fatigue.  No leg pain or edema recent travels or surgeries.  No bleeding diathesis.  Upon presentation to the emergency room, vital signs were within normal except for blood pressure of 141/84 and pulse oximetry of 90% on room air that was up to 95% on 2 L of O2 by nasal cannula.  Labs revealed unremarkable CMP except for mild hyponatremia and hypochloremia.  LDH was elevated at 354 and ferritin 1030 with procalcitonin of 0.2.  High-sensitivity troponin I was 12 twice and CBC was unremarkable.  Fibrin derivatives D-dimer was 1052.48 with fibrinogen of 651. UA was unremarkable.  Chest x-ray showed mild diffuse bilateral infiltrates.  Chest CTA revealed moderate to marked severe diffuse bilateral infiltrates with mild bilateral hilar lymphadenopathy, right greater than left and no evidence for PE.  The patient was given 1 g p.o. Tylenol and 6 mg of IV Decadron.  He will be admitted to a  medical monitored bed for further evaluation and management. PAST MEDICAL HISTORY:   Past Medical History:  Diagnosis Date   Heart murmur    as a child  Severe obesity  PAST SURGICAL HISTORY:   Past Surgical History:  Procedure Laterality Date   COLONOSCOPY    Tonsillectomy  SOCIAL HISTORY:   Social History   Tobacco Use   Smoking status: Never Smoker   Smokeless tobacco: Current User    Types: Chew  Substance Use Topics   Alcohol use: Yes    Comment: occasionally   He dip snuffs tobacco.  FAMILY HISTORY:   Family History  Problem Relation Age of Onset   COPD Mother    Heart attack Father    Colon cancer Maternal Grandmother    Heart attack Paternal Grandmother     DRUG ALLERGIES:  No Known Allergies  REVIEW OF SYSTEMS:   ROS As per history of present illness. All pertinent systems were reviewed above. Constitutional,  HEENT, cardiovascular, respiratory, GI, GU, musculoskeletal, neuro, psychiatric, endocrine,  integumentary and hematologic systems were reviewed and are otherwise  negative/unremarkable except for positive findings mentioned above in the HPI.   MEDICATIONS AT HOME:   Prior to Admission medications   Medication Sig Start Date End Date Taking? Authorizing Provider  KRILL OIL PO Take by mouth.    [provider]  Red Yeast Rice Extract (RED YEAST RICE PO) Take by mouth.    [provider]      VITAL SIGNS:  Blood pressure (!) 141/87, pulse 78, temperature 100.1 F (37.8 C), temperature source Oral, resp. rate 20, height 5\' 6"  (1.676 m), weight 136.1 kg, SpO2 97 %.  PHYSICAL EXAMINATION:  Physical Exam  GENERAL:  44 y.o.-year-old male patient lying in the bed with mild respiratory distress with conversational dyspnea. EYES: Pupils equal, round, reactive to light and accommodation. No scleral icterus. Extraocular muscles intact.  HEENT: Head atraumatic, normocephalic. Oropharynx and nasopharynx clear.  NECK:   Supple, no jugular venous distention. No thyroid enlargement, no tenderness.  LUNGS: Diminished bibasal breath sounds with bibasal and midlung zone crackles. CARDIOVASCULAR: Regular rate and rhythm, S1, S2 normal. No murmurs, rubs, or gallops.  ABDOMEN: Soft, nondistended, nontender. Bowel sounds present. No organomegaly or mass.  EXTREMITIES: No pedal edema, cyanosis, or clubbing.  NEUROLOGIC: Cranial nerves II through XII are intact. Muscle strength 5/5 in all extremities. Sensation intact. Gait not checked.  PSYCHIATRIC: The patient is alert and oriented x 3.  Normal affect and good eye contact. SKIN: No obvious rash, lesion, or ulcer.   LABORATORY PANEL:   CBC Recent Labs  Lab 06/18/19 2231  WBC 6.4  HGB 15.4  HCT 46.0  PLT 243   ------------------------------------------------------------------------------------------------------------------  Chemistries  Recent Labs  Lab 06/18/19 2231  NA 133*  K 3.8  CL 97*  CO2 24  GLUCOSE 124*  BUN 12  CREATININE 1.01  CALCIUM 8.8*  AST 64*  ALT 55*  ALKPHOS 45  BILITOT 0.5   ------------------------------------------------------------------------------------------------------------------  Cardiac Enzymes No results for input(s): TROPONINI in the last 168 hours. ------------------------------------------------------------------------------------------------------------------  RADIOLOGY:  CT Angio Chest PE W and/or Wo Contrast  Result Date: 06/19/2019 CLINICAL DATA:  Respiratory distress. EXAM: CT ANGIOGRAPHY CHEST WITH CONTRAST TECHNIQUE: Multidetector CT imaging of the chest was performed using the standard protocol during bolus administration of intravenous contrast. Multiplanar CT image reconstructions and MIPs were obtained to evaluate the vascular anatomy. CONTRAST:  71mL OMNIPAQUE IOHEXOL 350 MG/ML SOLN COMPARISON:  None. FINDINGS: Cardiovascular: Satisfactory opacification of the pulmonary arteries to the segmental  level. No evidence of pulmonary embolism. Normal heart size. No pericardial effusion. Mediastinum/Nodes: There is mild bilateral hilar lymphadenopathy, right greater than left. Lungs/Pleura: Moderate to marked severity diffuse slightly nodular appearing infiltrates are seen throughout both lungs. There is no evidence of a pleural effusion or pneumothorax. Upper Abdomen: No acute abnormality. Musculoskeletal: No chest wall abnormality. No acute or significant osseous findings. Review of the MIP images confirms the above findings. IMPRESSION: 1. Moderate to marked severity diffuse bilateral infiltrates. 2. Mild bilateral hilar lymphadenopathy, right greater than left. 3. No evidence of pulmonary embolism. Electronically Signed   By: Virgina Norfolk M.D.   On: 06/19/2019 01:25   DG Chest Portable 1 View  Result Date: 06/19/2019 CLINICAL DATA:  Fever. EXAM: PORTABLE CHEST 1 VIEW COMPARISON:  None. FINDINGS: Mild diffuse bilateral infiltrates are seen. There is no evidence of a pleural effusion or pneumothorax. The heart size and mediastinal contours are within normal limits. The visualized skeletal structures are unremarkable. IMPRESSION: Mild diffuse bilateral infiltrates. Electronically Signed   By: Virgina Norfolk M.D.   On: 06/19/2019 00:01      IMPRESSION AND PLAN:  1.  Acute hypoxemic respiratory failure secondary to COVID-19. -The patient will be admitted to a medically monitored isolation bed. -O2 protocol will be followed to keep O2 saturation above 93.   2.  Multifocal pneumonia secondary to COVID-19. -The patient will be admitted  to an isolation monitored bed with droplet and contact precautions. -The patient will be placed on scheduled Mucinex and as needed Tussionex. -We will follow CRP, ferritin, LDH and D-dimer. -Will follow manual differential for ANC/ALC ratio as well as follow troponin I and daily CBC with manual differential and CMP. - Will place the patient on IV Remdesivir and  IV steroid therapy with Decadron given elevated inflammatory markers and hypoxia. -The patient will be placed on vitamin D3, vitamin C, zinc sulfate, p.o. Pepcid and aspirin. -Actemra can be considered for CRP more than 7 with associated hypoxemia.  3.  Hyponatremia and hypochloremia. -This is likely secondary to mild volume depletion. -The patient will be placed on hydration with IV normal saline and will follow BMP.  4.  Elevated LFTs. -This could be related to COVID-19. -We will follow LFTs with hydration.  5.  DVT prophylaxis. -Subcutaneous Lovenox.    All the records are reviewed and case discussed with ED provider. The plan of care was discussed in details with the patient (and family). I answered all questions. The patient agreed to proceed with the above mentioned plan. Further management will depend upon hospital course.   CODE STATUS: Full code  Status is: Inpatient  Remains inpatient appropriate because:IV treatments appropriate due to intensity of illness or inability to take PO and Inpatient level of care appropriate due to severity of illness   Dispo: The patient is from: Home              Anticipated d/c is to: Home              Anticipated d/c date is: 3 days              Patient currently is not medically stable to d/c.   TOTAL TIME TAKING CARE OF THIS PATIENT: 55 minutes.    Hannah Beat M.D on 06/19/2019 at 2:59 AM  [facility]   From 7 PM-7 AM, contact night-coverage [email or web address]  CC: Primary care physician; Chrismon, Jodell Cipro, PA   Note: This dictation was prepared with Dragon dictation along with smaller phrase technology. Any transcriptional errors that result from this process are unintentional.

## 2019-06-19 NOTE — ED Notes (Signed)
Attempted to call report to floor but was placed on hold for several mins.

## 2019-06-19 NOTE — Progress Notes (Signed)
Same day rounding progress note  Patient seen and examined while in the ED waiting for the floor bed.  Requiring 2 L oxygen.  Feeling somewhat better.  1. Acute hypoxemic respiratory failure secondary to COVID-19. -Requires 2 L oxygen with oxygen saturations in mid 90s  2. Multifocal pneumonia secondary to COVID-19. - isolation monitored bed with droplet and contact precautions. - scheduled Mucinex and as needed Tussionex. - follow CRP, ferritin, LDH and D-dimer. - IV Remdesivir day 1/5 and IV steroid therapy with Decadron day 1/10  -Monitor inflammatory markers  -Continue vitamin D3, vitamin C, zinc sulfate, p.o. Pepcid and aspirin. -Actemra can be considered for CRP more than 7 with associated hypoxemia.  3.  Hyponatremia and hypochloremia. -This is likely secondary to mild volume depletion. -The patient will be placed on hydration with IV normal saline and will follow BMP.  4.  Elevated LFTs. -This could be related to COVID-19.  -Monitor LFTs with hydration.  5.  DVT prophylaxis. -Subcutaneous Lovenox.  Time spent: 15 minutes

## 2019-06-19 NOTE — ED Notes (Signed)
Pt ambulates self to bathroom w/o difficulty or need for assistance.

## 2019-06-19 NOTE — ED Notes (Signed)
Lab called for troponin blood draw.

## 2019-06-19 NOTE — ED Provider Notes (Signed)
[facility] Emergency Department Provider Note  ____________________________________________  Time seen: Approximately 12:47 AM  I have reviewed the triage vital signs and the nursing notes.   HISTORY  Chief Complaint Cough, Back Pain, Fever, and Sore Throat   HPI Jared Craig is a 44 y.o. male history of elevated BMI and hyperlipidemia who presents for evaluation of Covid-like symptoms.  Patient reports that he was tested for Covid 6 days ago after being exposed to his brother who tested positive.  4 days ago he started having symptoms that he describes as daily fever, body aches, cough, sore throat.  Earlier this morning he reports having some chest pain that he describes as a numbness in the center of his chest and both arms however that has resolved.  He denies any pleuritic chest pain, shortness of breath, personal or family history of blood clots, recent travel immobilization, leg pain or swelling, hemoptysis, or exogenous hormones.He is also complaining of low back pain however that is chronic for him.   No abdominal pain, no vomiting or diarrhea.  Past Medical History:  Diagnosis Date  . Heart murmur    as a child    Patient Active Problem List   Diagnosis Date Noted  . Obesity 03/22/2017  . Elevated hemoglobin A1c 03/22/2017  . Elevated cholesterol with elevated triglycerides 03/22/2017    Past Surgical History:  Procedure Laterality Date  . COLONOSCOPY      Prior to Admission medications   Medication Sig Start Date End Date Taking? Authorizing Provider  KRILL OIL PO Take by mouth.    [provider]  Red Yeast Rice Extract (RED YEAST RICE PO) Take by mouth.    [provider]    Allergies Patient has no known allergies.  Family History  Problem Relation Age of Onset  . COPD Mother   . Heart attack Father   . Colon cancer Maternal Grandmother   . Heart attack Paternal Grandmother     Social History Social  History   Tobacco Use  . Smoking status: Never Smoker  . Smokeless tobacco: Current User    Types: Chew  Substance Use Topics  . Alcohol use: Yes    Comment: occasionally   . Drug use: No    Review of Systems  Constitutional: + fever, body aches Eyes: Negative for visual changes. ENT: + sore throat. Neck: No neck pain  Cardiovascular: Negative for chest pain. Respiratory: Negative for shortness of breath. = cough Gastrointestinal: Negative for abdominal pain, vomiting or diarrhea. Genitourinary: Negative for dysuria. Musculoskeletal: Negative for back pain. Skin: Negative for rash. Neurological: Negative for headaches, weakness or numbness. Psych: No SI or HI  ____________________________________________   PHYSICAL EXAM:  VITAL SIGNS: ED Triage Vitals  Enc Vitals Group     BP 06/18/19 2239 (!) 141/84     Pulse Rate 06/18/19 2239 89     Resp 06/18/19 2239 18     Temp 06/18/19 2239 100.1 F (37.8 C)     Temp Source 06/18/19 2239 Oral     SpO2 06/18/19 2239 92 %     Weight 06/18/19 2117 300 lb (136.1 kg)     Height 06/18/19 2117 5\' 6"  (1.676 m)     Head Circumference --      Peak Flow --      Pain Score 06/18/19 2117 7     Pain Loc --      Pain Edu? --      Excl. in  GC? --     Constitutional: Alert and oriented. Well appearing and in no apparent distress. HEENT:      Head: Normocephalic and atraumatic.         Eyes: Conjunctivae are normal. Sclera is non-icteric.       Mouth/Throat: Mucous membranes are moist.       Neck: Supple with no signs of meningismus. Cardiovascular: Regular rate and rhythm. No murmurs, gallops, or rubs. 2+ symmetrical distal pulses are present in all extremities. No JVD. Respiratory: Normal respiratory effort. Lungs are clear to auscultation bilaterally. No wheezes, crackles, or rhonchi.  Gastrointestinal: Soft, non tender, and non distended with positive bowel sounds. No rebound or guarding. Musculoskeletal: Nontender with normal  range of motion in all extremities. No edema, cyanosis, or erythema of extremities. Neurologic: Normal speech and language. Face is symmetric. Moving all extremities. No gross focal neurologic deficits are appreciated. Skin: Skin is warm, dry and intact. No rash noted. Psychiatric: Mood and affect are normal. Speech and behavior are normal.  ____________________________________________   LABS (all labs ordered are listed, but only abnormal results are displayed)  Labs Reviewed  COMPREHENSIVE METABOLIC PANEL - Abnormal; Notable for the following components:      Result Value   Sodium 133 (*)    Chloride 97 (*)    Glucose, Bld 124 (*)    Calcium 8.8 (*)    AST 64 (*)    ALT 55 (*)    All other components within normal limits  URINALYSIS, COMPLETE (UACMP) WITH MICROSCOPIC - Abnormal; Notable for the following components:   Color, Urine YELLOW (*)    APPearance HAZY (*)    Hgb urine dipstick SMALL (*)    Protein, ur 100 (*)    All other components within normal limits  FIBRIN DERIVATIVES D-DIMER ([facility] ONLY) - Abnormal; Notable for the following components:   Fibrin derivatives D-dimer ([facility]) 1,052.48 (*)    All other components within normal limits  FERRITIN - Abnormal; Notable for the following components:   Ferritin 1,030 (*)    All other components within normal limits  FIBRINOGEN - Abnormal; Notable for the following components:   Fibrinogen 651 (*)    All other components within normal limits  LACTATE DEHYDROGENASE - Abnormal; Notable for the following components:   LDH 354 (*)    All other components within normal limits  CBC WITH DIFFERENTIAL/PLATELET  PROCALCITONIN  C-REACTIVE PROTEIN  HIV ANTIBODY (ROUTINE TESTING W REFLEX)  ABO/RH  TROPONIN I (HIGH SENSITIVITY)  TROPONIN I (HIGH SENSITIVITY)   ____________________________________________  EKG  ED ECG REPORT I, Rudene Re, the attending physician, personally viewed and interpreted this ECG.  Normal  sinus rhythm, rate of 92, normal intervals, normal axis, no ST elevations or depressions.  Normal EKG ____________________________________________  RADIOLOGY  I have personally reviewed the images performed during this visit and I agree with the Radiologist's read.   Interpretation by Radiologist:  CT Angio Chest PE W and/or Wo Contrast  Result Date: 06/19/2019 CLINICAL DATA:  Respiratory distress. EXAM: CT ANGIOGRAPHY CHEST WITH CONTRAST TECHNIQUE: Multidetector CT imaging of the chest was performed using the standard protocol during bolus administration of intravenous contrast. Multiplanar CT image reconstructions and MIPs were obtained to evaluate the vascular anatomy. CONTRAST:  52mL OMNIPAQUE IOHEXOL 350 MG/ML SOLN COMPARISON:  None. FINDINGS: Cardiovascular: Satisfactory opacification of the pulmonary arteries to the segmental level. No evidence of pulmonary embolism. Normal heart size. No pericardial effusion. Mediastinum/Nodes: There is mild bilateral hilar lymphadenopathy, right greater  than left. Lungs/Pleura: Moderate to marked severity diffuse slightly nodular appearing infiltrates are seen throughout both lungs. There is no evidence of a pleural effusion or pneumothorax. Upper Abdomen: No acute abnormality. Musculoskeletal: No chest wall abnormality. No acute or significant osseous findings. Review of the MIP images confirms the above findings. IMPRESSION: 1. Moderate to marked severity diffuse bilateral infiltrates. 2. Mild bilateral hilar lymphadenopathy, right greater than left. 3. No evidence of pulmonary embolism. Electronically Signed   By: Aram Candela M.D.   On: 06/19/2019 01:25   DG Chest Portable 1 View  Result Date: 06/19/2019 CLINICAL DATA:  Fever. EXAM: PORTABLE CHEST 1 VIEW COMPARISON:  None. FINDINGS: Mild diffuse bilateral infiltrates are seen. There is no evidence of a pleural effusion or pneumothorax. The heart size and mediastinal contours are within normal limits.  The visualized skeletal structures are unremarkable. IMPRESSION: Mild diffuse bilateral infiltrates. Electronically Signed   By: Aram Candela M.D.   On: 06/19/2019 00:01     ____________________________________________   PROCEDURES  Procedure(s) performed:yes .1-3 Lead EKG Interpretation Performed by: Nita Sickle, MD Authorized by: Nita Sickle, MD     Interpretation: normal     ECG rate:  90   ECG rate assessment: normal     Rhythm: sinus rhythm     Ectopy: none     Conduction: normal     Critical Care performed: yes  CRITICAL CARE Performed by: Nita Sickle  ?  Total critical care time: 40 min  Critical care time was exclusive of separately billable procedures and treating other patients.  Critical care was necessary to treat or prevent imminent or life-threatening deterioration.  Critical care was time spent personally by me on the following activities: development of treatment plan with patient and/or surrogate as well as nursing, discussions with consultants, evaluation of patient's response to treatment, examination of patient, obtaining history from patient or surrogate, ordering and performing treatments and interventions, ordering and review of laboratory studies, ordering and review of radiographic studies, pulse oximetry and re-evaluation of patient's condition.  ____________________________________________   INITIAL IMPRESSION / ASSESSMENT AND PLAN / ED COURSE  44 y.o. male history of elevated BMI and hyperlipidemia who presents for evaluation of Covid-like symptoms after he tested +6 days ago.  Patient is well-appearing and in no distress with normal work of breathing, normal sats, lungs are clear to auscultation.  Patient satting between 92 and 98% with ambulation.  Chest x-ray was visualized and interpreted by me with bilateral infiltrates consistent with viral pneumonia from Covid.  Read confirmed by radiology.  EKG with no evidence of  ischemia or dysrhythmias.  High-sensitivity troponin x2 - with no evidence of Covid myocarditis.  Patient placed on telemetry for cardiopulmonary monitoring.  D-dimer is elevated therefore will get a CT angiogram to rule out Covid induced PE.  Mild transaminitis which has been seen with Covid.  Glucose slightly elevated but is nonfasting.  Discussed this finding with patient and recommended fasting glucose and A1c done by primary care doctor as an outpatient.  No signs of anemia.  No leukocytosis.  No signs of significant dehydration.  Procalcitonin of 0.20 therefore will hold antibiotics at this time.  Patient is not hypoxic but sats did go down to 92% with ambulation therefore will give a dose of Decadron.  Will give Tylenol for fever.  Old medical records have been reviewed.  Rhythm strip has been visualized by me with no signs of ischemia or dysrhythmias  _________________________ 2:05 AM on 06/19/2019 -----------------------------------------  CTA visualized and evaluated by me showing bilateral Covid pneumonia but no evidence of PE.  Confirmed by radiology.  Patient is now hypoxic to the upper 80s has been placed on 2 L nasal cannula.  Will start patient on remdesivir and get him admitted to the hospitalist service.      _____________________________________________ Please note:  Patient was evaluated in Emergency Department today for the symptoms described in the history of present illness. Patient was evaluated in the context of the global COVID-19 pandemic, which necessitated consideration that the patient might be at risk for infection with the SARS-CoV-2 virus that causes COVID-19. Institutional protocols and algorithms that pertain to the evaluation of patients at risk for COVID-19 are in a state of rapid change based on information released by regulatory bodies including the CDC and federal and state organizations. These policies and algorithms were followed during the patient's care in the  ED.  Some ED evaluations and interventions may be delayed as a result of limited staffing during the pandemic.   [place] Controlled Substance Database was reviewed by me. ____________________________________________   FINAL CLINICAL IMPRESSION(S) / ED DIAGNOSES   Final diagnoses:  Acute respiratory failure with hypoxia (HCC)  Pneumonia due to COVID-19 virus      NEW MEDICATIONS STARTED DURING THIS VISIT:  ED Discharge Orders    None       Note:  This document was prepared using Dragon voice recognition software and may include unintentional dictation errors.    Don Perking, [place], MD 06/19/19 [phone]

## 2019-06-19 NOTE — Progress Notes (Signed)
Remdesivir - Pharmacy Brief Note   O:  CXR: IMPRESSION: 1. Moderate to marked severity diffuse bilateral infiltrates. 2. Mild bilateral hilar lymphadenopathy, right greater than left. 3. No evidence of pulmonary embolism. SpO2: 92% on RA; 95% on 2L [place]   A/P:  Remdesivir 200 mg IVPB once followed by 100 mg IVPB daily x 4 days.   Thomasene Ripple, PharmD, BCPS Clinical Pharmacist 06/19/2019 3:02 AM

## 2019-06-19 NOTE — ED Notes (Signed)
Pt oxygen sats drop when resting to high 80's (88/89). Pt denies hx sleep apnea. Pt placed on 2L [place] and sats improved to 94% with good waveform.

## 2019-06-20 DIAGNOSIS — U071 COVID-19: Secondary | ICD-10-CM | POA: Diagnosis not present

## 2019-06-20 DIAGNOSIS — J9601 Acute respiratory failure with hypoxia: Secondary | ICD-10-CM | POA: Diagnosis not present

## 2019-06-20 LAB — CBC WITH DIFFERENTIAL/PLATELET
Abs Immature Granulocytes: 0.05 10*3/uL (ref 0.00–0.07)
Basophils Absolute: 0 10*3/uL (ref 0.0–0.1)
Basophils Relative: 0 %
Eosinophils Absolute: 0 10*3/uL (ref 0.0–0.5)
Eosinophils Relative: 0 %
HCT: 42.8 % (ref 39.0–52.0)
Hemoglobin: 14.1 g/dL (ref 13.0–17.0)
Immature Granulocytes: 1 %
Lymphocytes Relative: 13 %
Lymphs Abs: 1.3 10*3/uL (ref 0.7–4.0)
MCH: 28.5 pg (ref 26.0–34.0)
MCHC: 32.9 g/dL (ref 30.0–36.0)
MCV: 86.6 fL (ref 80.0–100.0)
Monocytes Absolute: 0.4 10*3/uL (ref 0.1–1.0)
Monocytes Relative: 4 %
Neutro Abs: 8.1 10*3/uL — ABNORMAL HIGH (ref 1.7–7.7)
Neutrophils Relative %: 82 %
Platelets: 288 10*3/uL (ref 150–400)
RBC: 4.94 MIL/uL (ref 4.22–5.81)
RDW: 12.7 % (ref 11.5–15.5)
WBC: 9.9 10*3/uL (ref 4.0–10.5)
nRBC: 0 % (ref 0.0–0.2)

## 2019-06-20 LAB — COMPREHENSIVE METABOLIC PANEL
ALT: 42 U/L (ref 0–44)
AST: 38 U/L (ref 15–41)
Albumin: 3.2 g/dL — ABNORMAL LOW (ref 3.5–5.0)
Alkaline Phosphatase: 38 U/L (ref 38–126)
Anion gap: 9 (ref 5–15)
BUN: 16 mg/dL (ref 6–20)
CO2: 26 mmol/L (ref 22–32)
Calcium: 8.4 mg/dL — ABNORMAL LOW (ref 8.9–10.3)
Chloride: 103 mmol/L (ref 98–111)
Creatinine, Ser: 0.9 mg/dL (ref 0.61–1.24)
GFR calc Af Amer: >60 mL/min (ref >60)
GFR calc non Af Amer: >60 mL/min (ref >60)
Glucose, Bld: 164 mg/dL — ABNORMAL HIGH (ref 70–99)
Potassium: 4.6 mmol/L (ref 3.5–5.1)
Sodium: 138 mmol/L (ref 135–145)
Total Bilirubin: 0.4 mg/dL (ref 0.3–1.2)
Total Protein: 6.7 g/dL (ref 6.5–8.1)

## 2019-06-20 LAB — FERRITIN: Ferritin: 768 ng/mL — ABNORMAL HIGH (ref 24–336)

## 2019-06-20 LAB — C-REACTIVE PROTEIN: CRP: 6.7 mg/dL — ABNORMAL HIGH (ref <1.0)

## 2019-06-20 LAB — TROPONIN I (HIGH SENSITIVITY): Troponin I (High Sensitivity): 8 ng/L (ref <18)

## 2019-06-20 LAB — FIBRIN DERIVATIVES D-DIMER (ARMC ONLY): Fibrin derivatives D-dimer (ARMC): 806.75 ng/mL (FEU) — ABNORMAL HIGH (ref 0.00–499.00)

## 2019-06-20 NOTE — Plan of Care (Signed)
Pt and spouse verbalized understanding of all education provided prior to discharge to include current medication regimen and keeping all follow up appointments.

## 2019-06-20 NOTE — Plan of Care (Signed)
Continue with current plan of care.

## 2019-06-20 NOTE — Progress Notes (Signed)
[place] at [facility]   PATIENT NAME: Jared Craig    MR#:  [id number]  DATE OF BIRTH:  October 05, 1975  SUBJECTIVE:  CHIEF COMPLAINT:   Chief Complaint  Patient presents with  . Cough  . Back Pain  . Fever  . Sore Throat  improving. No new c/o, cough + REVIEW OF SYSTEMS:  Review of Systems  Constitutional: Negative for diaphoresis, fever, malaise/fatigue and weight loss.  HENT: Negative for ear discharge, ear pain, hearing loss, nosebleeds, sore throat and tinnitus.   Eyes: Negative for blurred vision and pain.  Respiratory: Positive for cough. Negative for hemoptysis, shortness of breath and wheezing.   Cardiovascular: Negative for chest pain, palpitations, orthopnea and leg swelling.  Gastrointestinal: Negative for abdominal pain, blood in stool, constipation, diarrhea, heartburn, nausea and vomiting.  Genitourinary: Negative for dysuria, frequency and urgency.  Musculoskeletal: Negative for back pain and myalgias.  Skin: Negative for itching and rash.  Neurological: Negative for dizziness, tingling, tremors, focal weakness, seizures, weakness and headaches.  Psychiatric/Behavioral: Negative for depression. The patient is not nervous/anxious.    DRUG ALLERGIES:  No Known Allergies VITALS:  Blood pressure 132/72, pulse 68, temperature 98.6 F (37 C), temperature source Oral, resp. rate 17, height 5\' 6"  (1.676 m), weight 136.1 kg, SpO2 93 %. PHYSICAL EXAMINATION:  Physical Exam HENT:     Head: Normocephalic and atraumatic.  Eyes:     Conjunctiva/sclera: Conjunctivae normal.     Pupils: Pupils are equal, round, and reactive to light.  Neck:     Thyroid: No thyromegaly.     Trachea: No tracheal deviation.  Cardiovascular:     Rate and Rhythm: Normal rate and regular rhythm.     Heart sounds: Normal heart sounds.  Pulmonary:     Effort: Pulmonary effort is normal. No respiratory distress.     Breath sounds: Normal breath sounds. No wheezing.  Chest:   Chest wall: No tenderness.  Abdominal:     General: Bowel sounds are normal. There is no distension.     Palpations: Abdomen is soft.     Tenderness: There is no abdominal tenderness.  Musculoskeletal:        General: Normal range of motion.     Cervical back: Normal range of motion and neck supple.  Skin:    General: Skin is warm and dry.     Findings: No rash.  Neurological:     Mental Status: He is alert and oriented to person, place, and time.     Cranial Nerves: No cranial nerve deficit.    LABORATORY PANEL:  Male CBC Recent Labs  Lab 06/20/19 0706  WBC 9.9  HGB 14.1  HCT 42.8  PLT 288   ------------------------------------------------------------------------------------------------------------------ Chemistries  Recent Labs  Lab 06/20/19 0706  NA 138  K 4.6  CL 103  CO2 26  GLUCOSE 164*  BUN 16  CREATININE 0.90  CALCIUM 8.4*  AST 38  ALT 42  ALKPHOS 38  BILITOT 0.4   RADIOLOGY:  No results found. ASSESSMENT AND PLAN:   1. Acute hypoxemic respiratory failure secondary to COVID-19. -Requires 2 L oxygen with oxygen saturations in mid 90s  2. Multifocal pneumonia secondary to COVID-19. - isolation monitored bed with droplet and contact precautions. - scheduled Mucinex and as needed Tussionex. - follow CRP, ferritin, LDH and D-dimer. - IV Remdesivir day 2/5 and IV steroid therapy with Decadron day 2/10  -Monitor inflammatory markers -Continue vitamin D3, vitamin C, zinc sulfate, p.o. Pepcid and aspirin. -Actemra  can be considered for CRP more than 7 with associated hypoxemia.  3. Hyponatremia and hypochloremia. -resolved with hydration.  4. Elevated LFTs. -This could be related to COVID-19.  -Monitor LFTs with hydration.    Status is: Inpatient  Remains inpatient appropriate because:Inpatient level of care appropriate due to severity of illness   Dispo: The patient is from: Home              Anticipated d/c is to: Home               Anticipated d/c date is: 3 days              Patient currently is not medically stable to d/c.        DVT prophylaxis: Lovenox Family Communication: discussed with patient   All the records are reviewed and case discussed with Care Management/Social Worker. Management plans discussed with the patient, nursing and they are in agreement.  CODE STATUS: Full Code  TOTAL TIME TAKING CARE OF THIS PATIENT: 35 minutes.   More than 50% of the time was spent in counseling/coordination of care: YES  POSSIBLE D/C IN 3-4 DAYS, DEPENDING ON CLINICAL CONDITION.   Max Sane M.D on 06/20/2019 at 4:52 PM  [facility]   CC: Primary care physician; Margo Common, PA  Note: This dictation was prepared with Dragon dictation along with smaller phrase technology. Any transcriptional errors that result from this process are unintentional.

## 2019-06-21 DIAGNOSIS — J9601 Acute respiratory failure with hypoxia: Secondary | ICD-10-CM | POA: Diagnosis not present

## 2019-06-21 DIAGNOSIS — U071 COVID-19: Secondary | ICD-10-CM | POA: Diagnosis not present

## 2019-06-21 LAB — CBC WITH DIFFERENTIAL/PLATELET
Abs Immature Granulocytes: 0.08 10*3/uL — ABNORMAL HIGH (ref 0.00–0.07)
Basophils Absolute: 0 10*3/uL (ref 0.0–0.1)
Basophils Relative: 0 %
Eosinophils Absolute: 0 10*3/uL (ref 0.0–0.5)
Eosinophils Relative: 0 %
HCT: 43.4 % (ref 39.0–52.0)
Hemoglobin: 14.1 g/dL (ref 13.0–17.0)
Immature Granulocytes: 1 %
Lymphocytes Relative: 18 %
Lymphs Abs: 1.8 10*3/uL (ref 0.7–4.0)
MCH: 28.5 pg (ref 26.0–34.0)
MCHC: 32.5 g/dL (ref 30.0–36.0)
MCV: 87.9 fL (ref 80.0–100.0)
Monocytes Absolute: 0.5 10*3/uL (ref 0.1–1.0)
Monocytes Relative: 5 %
Neutro Abs: 7.8 10*3/uL — ABNORMAL HIGH (ref 1.7–7.7)
Neutrophils Relative %: 76 %
Platelets: 352 10*3/uL (ref 150–400)
RBC: 4.94 MIL/uL (ref 4.22–5.81)
RDW: 12.7 % (ref 11.5–15.5)
WBC: 10.2 10*3/uL (ref 4.0–10.5)
nRBC: 0 % (ref 0.0–0.2)

## 2019-06-21 LAB — COMPREHENSIVE METABOLIC PANEL
ALT: 40 U/L (ref 0–44)
AST: 31 U/L (ref 15–41)
Albumin: 3 g/dL — ABNORMAL LOW (ref 3.5–5.0)
Alkaline Phosphatase: 42 U/L (ref 38–126)
Anion gap: 8 (ref 5–15)
BUN: 19 mg/dL (ref 6–20)
CO2: 27 mmol/L (ref 22–32)
Calcium: 8.3 mg/dL — ABNORMAL LOW (ref 8.9–10.3)
Chloride: 108 mmol/L (ref 98–111)
Creatinine, Ser: 0.67 mg/dL (ref 0.61–1.24)
GFR calc Af Amer: >60 mL/min (ref >60)
GFR calc non Af Amer: >60 mL/min (ref >60)
Glucose, Bld: 155 mg/dL — ABNORMAL HIGH (ref 70–99)
Potassium: 4.5 mmol/L (ref 3.5–5.1)
Sodium: 143 mmol/L (ref 135–145)
Total Bilirubin: 0.6 mg/dL (ref 0.3–1.2)
Total Protein: 6.6 g/dL (ref 6.5–8.1)

## 2019-06-21 LAB — C-REACTIVE PROTEIN: CRP: 4.2 mg/dL — ABNORMAL HIGH (ref <1.0)

## 2019-06-21 LAB — FIBRIN DERIVATIVES D-DIMER (ARMC ONLY): Fibrin derivatives D-dimer (ARMC): 745.23 ng/mL (FEU) — ABNORMAL HIGH (ref 0.00–499.00)

## 2019-06-21 LAB — FERRITIN: Ferritin: 717 ng/mL — ABNORMAL HIGH (ref 24–336)

## 2019-06-21 MED ORDER — FUROSEMIDE 10 MG/ML IJ SOLN
40.0000 mg | Freq: Once | INTRAMUSCULAR | Status: AC
  Administered 2019-06-21: 16:00:00 40 mg via INTRAVENOUS
  Filled 2019-06-21: qty 4

## 2019-06-21 NOTE — Progress Notes (Signed)
[place] at [place] NAME: Jared Craig    MR#:  [id number]  DATE OF BIRTH:  1975/08/04  SUBJECTIVE:  CHIEF COMPLAINT:   Chief Complaint  Patient presents with  . Cough  . Back Pain  . Fever  . Sore Throat  improving. No new c/o, cough +, on 3 L nasal cannula oxygen REVIEW OF SYSTEMS:  Review of Systems  Constitutional: Negative for diaphoresis, fever, malaise/fatigue and weight loss.  HENT: Negative for ear discharge, ear pain, hearing loss, nosebleeds, sore throat and tinnitus.   Eyes: Negative for blurred vision and pain.  Respiratory: Positive for cough. Negative for hemoptysis, shortness of breath and wheezing.   Cardiovascular: Negative for chest pain, palpitations, orthopnea and leg swelling.  Gastrointestinal: Negative for abdominal pain, blood in stool, constipation, diarrhea, heartburn, nausea and vomiting.  Genitourinary: Negative for dysuria, frequency and urgency.  Musculoskeletal: Negative for back pain and myalgias.  Skin: Negative for itching and rash.  Neurological: Negative for dizziness, tingling, tremors, focal weakness, seizures, weakness and headaches.  Psychiatric/Behavioral: Negative for depression. The patient is not nervous/anxious.    DRUG ALLERGIES:  No Known Allergies VITALS:  Blood pressure 126/71, pulse (!) 58, temperature 97.8 F (36.6 C), temperature source Oral, resp. rate 16, height 5\' 6"  (1.676 m), weight 136.1 kg, SpO2 94 %. PHYSICAL EXAMINATION:  Physical Exam HENT:     Head: Normocephalic and atraumatic.  Eyes:     Conjunctiva/sclera: Conjunctivae normal.     Pupils: Pupils are equal, round, and reactive to light.  Neck:     Thyroid: No thyromegaly.     Trachea: No tracheal deviation.  Cardiovascular:     Rate and Rhythm: Normal rate and regular rhythm.     Heart sounds: Normal heart sounds.  Pulmonary:     Effort: Pulmonary effort is normal. No respiratory distress.     Breath sounds: Normal breath  sounds. No wheezing.  Chest:     Chest wall: No tenderness.  Abdominal:     General: Bowel sounds are normal. There is no distension.     Palpations: Abdomen is soft.     Tenderness: There is no abdominal tenderness.  Musculoskeletal:        General: Normal range of motion.     Cervical back: Normal range of motion and neck supple.  Skin:    General: Skin is warm and dry.     Findings: No rash.  Neurological:     Mental Status: He is alert and oriented to person, place, and time.     Cranial Nerves: No cranial nerve deficit.    LABORATORY PANEL:  Male CBC Recent Labs  Lab 06/21/19 0528  WBC 10.2  HGB 14.1  HCT 43.4  PLT 352   ------------------------------------------------------------------------------------------------------------------ Chemistries  Recent Labs  Lab 06/21/19 0528  NA 143  K 4.5  CL 108  CO2 27  GLUCOSE 155*  BUN 19  CREATININE 0.67  CALCIUM 8.3*  AST 31  ALT 40  ALKPHOS 42  BILITOT 0.6   RADIOLOGY:  No results found. ASSESSMENT AND PLAN:   1. Acute hypoxemic respiratory failure secondary to COVID-19. -Requires 2 L oxygen with oxygen saturations in mid 90s  2. Multifocal pneumonia secondary to COVID-19. - isolation monitored bed with droplet and contact precautions. - scheduled Mucinex and as needed Tussionex. - follow CRP, ferritin, LDH and D-dimer. - IV Remdesivir day 3/5 and IV steroid therapy with Decadron day 3/10  -Monitor inflammatory markers -Continue vitamin  D3, vitamin C, zinc sulfate, p.o. Pepcid and aspirin.  3. Hyponatremia and hypochloremia. -resolved with hydration.  4. Elevated LFTs. -This could be related to COVID-19.  -Monitor LFTs with hydration.    Status is: Inpatient  Remains inpatient appropriate because:Inpatient level of care appropriate due to severity of illness   Dispo: The patient is from: Home              Anticipated d/c is to: Home              Anticipated d/c date is: 2 days               Patient currently is not medically stable to d/c.        DVT prophylaxis: Lovenox Family Communication: discussed with patient   All the records are reviewed and case discussed with Care Management/Social Worker. Management plans discussed with the patient, nursing and they are in agreement.  CODE STATUS: Full Code  TOTAL TIME TAKING CARE OF THIS PATIENT: 35 minutes.   More than 50% of the time was spent in counseling/coordination of care: YES  POSSIBLE D/C IN 2-3 DAYS, DEPENDING ON CLINICAL CONDITION.   Jared Craig M.D on 06/21/2019 at 2:40 PM  [facility]   CC: Primary care physician; Jared Roers, PA  Note: This dictation was prepared with Dragon dictation along with smaller phrase technology. Any transcriptional errors that result from this process are unintentional.

## 2019-06-21 NOTE — Progress Notes (Signed)
Pt ambulated from bed to bathroom and then back to bed on room air. Pt oxygen dropped to 87% while ambulating on room air. Pt placed on 1L nasal cannula. Pt oxygen returned to 94% on 1L nasal cannula.

## 2019-06-21 NOTE — Progress Notes (Signed)
Verbal confirmation of positive Covid antibody test on 06/13/19 from [place] at the [facility]. Tested done at [facility].

## 2019-06-22 DIAGNOSIS — U071 COVID-19: Secondary | ICD-10-CM | POA: Diagnosis not present

## 2019-06-22 DIAGNOSIS — J9601 Acute respiratory failure with hypoxia: Secondary | ICD-10-CM | POA: Diagnosis not present

## 2019-06-22 LAB — CBC WITH DIFFERENTIAL/PLATELET
Abs Immature Granulocytes: 0.19 10*3/uL — ABNORMAL HIGH (ref 0.00–0.07)
Basophils Absolute: 0 10*3/uL (ref 0.0–0.1)
Basophils Relative: 0 %
Eosinophils Absolute: 0 10*3/uL (ref 0.0–0.5)
Eosinophils Relative: 0 %
HCT: 43 % (ref 39.0–52.0)
Hemoglobin: 14.3 g/dL (ref 13.0–17.0)
Immature Granulocytes: 2 %
Lymphocytes Relative: 22 %
Lymphs Abs: 2.5 10*3/uL (ref 0.7–4.0)
MCH: 28.5 pg (ref 26.0–34.0)
MCHC: 33.3 g/dL (ref 30.0–36.0)
MCV: 85.7 fL (ref 80.0–100.0)
Monocytes Absolute: 0.6 10*3/uL (ref 0.1–1.0)
Monocytes Relative: 5 %
Neutro Abs: 7.8 10*3/uL — ABNORMAL HIGH (ref 1.7–7.7)
Neutrophils Relative %: 71 %
Platelets: 431 10*3/uL — ABNORMAL HIGH (ref 150–400)
RBC: 5.02 MIL/uL (ref 4.22–5.81)
RDW: 12.7 % (ref 11.5–15.5)
Smear Review: NORMAL
WBC: 11.2 10*3/uL — ABNORMAL HIGH (ref 4.0–10.5)
nRBC: 0 % (ref 0.0–0.2)

## 2019-06-22 LAB — FERRITIN: Ferritin: 871 ng/mL — ABNORMAL HIGH (ref 24–336)

## 2019-06-22 LAB — COMPREHENSIVE METABOLIC PANEL
ALT: 72 U/L — ABNORMAL HIGH (ref 0–44)
AST: 66 U/L — ABNORMAL HIGH (ref 15–41)
Albumin: 3 g/dL — ABNORMAL LOW (ref 3.5–5.0)
Alkaline Phosphatase: 42 U/L (ref 38–126)
Anion gap: 10 (ref 5–15)
BUN: 25 mg/dL — ABNORMAL HIGH (ref 6–20)
CO2: 29 mmol/L (ref 22–32)
Calcium: 8.4 mg/dL — ABNORMAL LOW (ref 8.9–10.3)
Chloride: 101 mmol/L (ref 98–111)
Creatinine, Ser: 0.87 mg/dL (ref 0.61–1.24)
GFR calc Af Amer: >60 mL/min (ref >60)
GFR calc non Af Amer: >60 mL/min (ref >60)
Glucose, Bld: 158 mg/dL — ABNORMAL HIGH (ref 70–99)
Potassium: 4.6 mmol/L (ref 3.5–5.1)
Sodium: 140 mmol/L (ref 135–145)
Total Bilirubin: 0.8 mg/dL (ref 0.3–1.2)
Total Protein: 6.4 g/dL — ABNORMAL LOW (ref 6.5–8.1)

## 2019-06-22 LAB — FIBRIN DERIVATIVES D-DIMER (ARMC ONLY): Fibrin derivatives D-dimer (ARMC): 626.3 ng/mL (FEU) — ABNORMAL HIGH (ref 0.00–499.00)

## 2019-06-22 LAB — C-REACTIVE PROTEIN: CRP: 1.7 mg/dL — ABNORMAL HIGH (ref <1.0)

## 2019-06-22 NOTE — TOC Progression Note (Signed)
Transition of Care [facility]) - Progression Note    Patient Details  Name: Jared Craig MRN: [id number] Date of Birth: 1975/10/17  Transition of Care [facility]) CM/SW Contact  Ajani Schnieders, Lemar Livings, LCSW Phone Number: 06/22/2019, 3:40 PM  Clinical Narrative:   Asking MD and bedside RN if pt will require home O2, he is currently on 2 liters at this time continousally. Have asked bedside RN to check his sats while up ambulating and document for justification         Expected Discharge Plan and Services                                                 Social Determinants of Health (SDOH) Interventions    Readmission Risk Interventions No flowsheet data found.

## 2019-06-22 NOTE — Progress Notes (Signed)
Pt weaned to room air prior to sleeping. Sats dropping to 86/88%.  HR elevated to 110, ST. Pt denies chest pain or shortness of breath with elevation and decreased sats. Pt states he was sleeping and started coughing.  O2 placed at 2L/[place], HR dropped back into 50/60's with sats of 94%. Will leave O2@2L  for the duration of the night

## 2019-06-22 NOTE — Progress Notes (Signed)
SATURATION QUALIFICATIONS: (This note is used to comply with regulatory documentation for home oxygen)  Patient Saturations on Room Air at Rest = 93%  Patient Saturations on Room Air while Ambulating = 87%  Patient Saturations on 2 Liters of oxygen while Ambulating = 96%  Please briefly explain why patient needs home oxygen:  Pt oxygen saturation decreased to 87% while ambulating on room air. Pt required 2L via nasal cannula to be placed on the patient. This qualifies the patient for oxygen at discharge.

## 2019-06-22 NOTE — Progress Notes (Signed)
PT Cancellation Note  Patient Details Name: Jared Craig MRN: [id number] DOB: 1975/05/19   Cancelled Treatment:    Reason Eval/Treat Not Completed: (Consult received and chart reviewed. Per discussion with primary RN, patient indep without assist device; no safety needs, mobility concerns or acute PT needs identified.  Will complete order at this time; please re-consult should needs change.)   Korissa Horsford H. Manson Passey, PT, DPT, NCS 06/22/19, 9:36 AM [phone]

## 2019-06-22 NOTE — TOC Progression Note (Signed)
Transition of Care [facility]) - Progression Note    Patient Details  Name: Jared Craig MRN: [id number] Date of Birth: 11/23/1975  Transition [facility] Care [facility]) CM/SW Contact  Josearmando Kuhnert, Lemar Livings, LCSW Phone Number: 06/22/2019, 4:00 PM  Clinical Narrative:  Have gone ahead and ordered Home O2 for pt in anticipation of DC tomorrow. MD has written order for this and bedside RN has charted O2 sats. Will see in am for nay other needs. [employer] aware and will deliver to room prior to DC.          Expected Discharge Plan and Services                                                 Social Determinants of Health (SDOH) Interventions    Readmission Risk Interventions No flowsheet data found.

## 2019-06-22 NOTE — Progress Notes (Signed)
[place] at [place] NAME: Jared Craig    MR#:  [id number]  DATE OF BIRTH:  [birth year]  SUBJECTIVE:  CHIEF COMPLAINT:   Chief Complaint  Patient presents with  . Cough  . Back Pain  . Fever  . Sore Throat  improving. No new c/o, cough +, on 2 L nasal cannula oxygen REVIEW OF SYSTEMS:  Review of Systems  Constitutional: Negative for diaphoresis, fever, malaise/fatigue and weight loss.  HENT: Negative for ear discharge, ear pain, hearing loss, nosebleeds, sore throat and tinnitus.   Eyes: Negative for blurred vision and pain.  Respiratory: Positive for cough. Negative for hemoptysis, shortness of breath and wheezing.   Cardiovascular: Negative for chest pain, palpitations, orthopnea and leg swelling.  Gastrointestinal: Negative for abdominal pain, blood in stool, constipation, diarrhea, heartburn, nausea and vomiting.  Genitourinary: Negative for dysuria, frequency and urgency.  Musculoskeletal: Negative for back pain and myalgias.  Skin: Negative for itching and rash.  Neurological: Negative for dizziness, tingling, tremors, focal weakness, seizures, weakness and headaches.  Psychiatric/Behavioral: Negative for depression. The patient is not nervous/anxious.    DRUG ALLERGIES:  No Known Allergies VITALS:  Blood pressure 137/84, pulse (!) 59, temperature 97.7 F (36.5 C), temperature source Oral, resp. rate 14, height 5\' 6"  (1.676 m), weight 136.1 kg, SpO2 93 %. PHYSICAL EXAMINATION:  Physical Exam HENT:     Head: Normocephalic and atraumatic.  Eyes:     Conjunctiva/sclera: Conjunctivae normal.     Pupils: Pupils are equal, round, and reactive to light.  Neck:     Thyroid: No thyromegaly.     Trachea: No tracheal deviation.  Cardiovascular:     Rate and Rhythm: Normal rate and regular rhythm.     Heart sounds: Normal heart sounds.  Pulmonary:     Effort: Pulmonary effort is normal. No respiratory distress.     Breath sounds: Normal breath  sounds. No wheezing.  Chest:     Chest wall: No tenderness.  Abdominal:     General: Bowel sounds are normal. There is no distension.     Palpations: Abdomen is soft.     Tenderness: There is no abdominal tenderness.  Musculoskeletal:        General: Normal range of motion.     Cervical back: Normal range of motion and neck supple.  Skin:    General: Skin is warm and dry.     Findings: No rash.  Neurological:     Mental Status: He is alert and oriented to person, place, and time.     Cranial Nerves: No cranial nerve deficit.    LABORATORY PANEL:  Male CBC Recent Labs  Lab 06/22/19 0540  WBC 11.2*  HGB 14.3  HCT 43.0  PLT 431*   ------------------------------------------------------------------------------------------------------------------ Chemistries  Recent Labs  Lab 06/22/19 0540  NA 140  K 4.6  CL 101  CO2 29  GLUCOSE 158*  BUN 25*  CREATININE 0.87  CALCIUM 8.4*  AST 66*  ALT 72*  ALKPHOS 42  BILITOT 0.8   RADIOLOGY:  No results found. ASSESSMENT AND PLAN:   1. Acute hypoxemic respiratory failure secondary to COVID-19. -on 2 L oxygen with oxygen saturations in mid 90s.  Wean oxygen as tolerated  2. Multifocal pneumonia secondary to COVID-19. - isolation monitored bed with droplet and contact precautions. - scheduled Mucinex and as needed Tussionex. - follow CRP, ferritin, LDH and D-dimer. - IV Remdesivir day 4/5 and IV steroid therapy with Decadron day 4/10  -  Monitor inflammatory markers -Continue vitamin D3, vitamin C, zinc sulfate, p.o. Pepcid and aspirin.  3. Hyponatremia and hypochloremia. -resolved with hydration.  4. Elevated LFTs. -This could be related to COVID-19.  -Monitor LFTs with hydration.    Status is: Inpatient  Remains inpatient appropriate because:Inpatient level of care appropriate due to severity of illness   Dispo: The patient is from: Home              Anticipated d/c is to: Home              Anticipated  d/c date is: 1 day              Patient currently is not medically stable to d/c.        DVT prophylaxis: Lovenox Family Communication: discussed with patient   All the records are reviewed and case discussed with Care Management/Social Worker. Management plans discussed with the patient, nursing and they are in agreement.  CODE STATUS: Full Code  TOTAL TIME TAKING CARE OF THIS PATIENT: 35 minutes.   More than 50% of the time was spent in counseling/coordination of care: YES  POSSIBLE D/C IN 1-2 DAYS, DEPENDING ON CLINICAL CONDITION.   Delfino Lovett M.D on 06/22/2019 at 1:29 PM  [facility]   CC: Primary care physician; Tamsen Roers, PA  Note: This dictation was prepared with Dragon dictation along with smaller phrase technology. Any transcriptional errors that result from this process are unintentional.

## 2019-06-23 DIAGNOSIS — J9601 Acute respiratory failure with hypoxia: Secondary | ICD-10-CM

## 2019-06-23 DIAGNOSIS — J1282 Pneumonia due to Coronavirus disease 2019: Secondary | ICD-10-CM | POA: Diagnosis not present

## 2019-06-23 DIAGNOSIS — U071 COVID-19: Secondary | ICD-10-CM | POA: Diagnosis not present

## 2019-06-23 LAB — CBC WITH DIFFERENTIAL/PLATELET
Abs Immature Granulocytes: 0.47 10*3/uL — ABNORMAL HIGH (ref 0.00–0.07)
Basophils Absolute: 0.1 10*3/uL (ref 0.0–0.1)
Basophils Relative: 1 %
Eosinophils Absolute: 0 10*3/uL (ref 0.0–0.5)
Eosinophils Relative: 0 %
HCT: 45.4 % (ref 39.0–52.0)
Hemoglobin: 14.9 g/dL (ref 13.0–17.0)
Immature Granulocytes: 4 %
Lymphocytes Relative: 22 %
Lymphs Abs: 2.5 10*3/uL (ref 0.7–4.0)
MCH: 28.2 pg (ref 26.0–34.0)
MCHC: 32.8 g/dL (ref 30.0–36.0)
MCV: 86 fL (ref 80.0–100.0)
Monocytes Absolute: 0.6 10*3/uL (ref 0.1–1.0)
Monocytes Relative: 5 %
Neutro Abs: 7.9 10*3/uL — ABNORMAL HIGH (ref 1.7–7.7)
Neutrophils Relative %: 68 %
Platelets: 474 10*3/uL — ABNORMAL HIGH (ref 150–400)
RBC: 5.28 MIL/uL (ref 4.22–5.81)
RDW: 12.7 % (ref 11.5–15.5)
WBC: 11.5 10*3/uL — ABNORMAL HIGH (ref 4.0–10.5)
nRBC: 0 % (ref 0.0–0.2)

## 2019-06-23 LAB — FERRITIN: Ferritin: 1147 ng/mL — ABNORMAL HIGH (ref 24–336)

## 2019-06-23 LAB — C-REACTIVE PROTEIN: CRP: 1 mg/dL — ABNORMAL HIGH (ref <1.0)

## 2019-06-23 LAB — FIBRIN DERIVATIVES D-DIMER (ARMC ONLY): Fibrin derivatives D-dimer (ARMC): 642.82 ng/mL (FEU) — ABNORMAL HIGH (ref 0.00–499.00)

## 2019-06-23 LAB — COMPREHENSIVE METABOLIC PANEL
ALT: 157 U/L — ABNORMAL HIGH (ref 0–44)
AST: 128 U/L — ABNORMAL HIGH (ref 15–41)
Albumin: 3.1 g/dL — ABNORMAL LOW (ref 3.5–5.0)
Alkaline Phosphatase: 43 U/L (ref 38–126)
Anion gap: 10 (ref 5–15)
BUN: 29 mg/dL — ABNORMAL HIGH (ref 6–20)
CO2: 28 mmol/L (ref 22–32)
Calcium: 8.7 mg/dL — ABNORMAL LOW (ref 8.9–10.3)
Chloride: 101 mmol/L (ref 98–111)
Creatinine, Ser: 0.82 mg/dL (ref 0.61–1.24)
GFR calc Af Amer: >60 mL/min (ref >60)
GFR calc non Af Amer: >60 mL/min (ref >60)
Glucose, Bld: 177 mg/dL — ABNORMAL HIGH (ref 70–99)
Potassium: 5 mmol/L (ref 3.5–5.1)
Sodium: 139 mmol/L (ref 135–145)
Total Bilirubin: 0.7 mg/dL (ref 0.3–1.2)
Total Protein: 6.6 g/dL (ref 6.5–8.1)

## 2019-06-23 MED ORDER — PREDNISONE 10 MG (21) PO TBPK: ORAL_TABLET | ORAL | 0 refills | Status: DC

## 2019-06-23 MED ORDER — ZINC SULFATE 220 (50 ZN) MG PO CAPS: 220.0000 mg | ORAL_CAPSULE | Freq: Every day | ORAL | 0 refills | Status: AC

## 2019-06-23 MED ORDER — ASCORBIC ACID 500 MG PO TABS: 500.0000 mg | ORAL_TABLET | Freq: Every day | ORAL | 0 refills | Status: AC

## 2019-06-23 NOTE — TOC Transition Note (Signed)
Transition of Care [facility]) - CM/SW Discharge Note   Patient Details  Name: Jared Craig MRN: [id number] Date of Birth: [birth year]  Transition of Care [facility]) CM/SW Contact:  Lucy Chris, LCSW Phone Number: 06/23/2019, 8:38 AM   Clinical Narrative:  Pt is set up with home O2 for home. [employer] aware and will deliver to room prior to DC. No other needs. Pt's wife can assist if needed. Pt is ambulating independently and doing well except for his deconditioning due to COVID. No further follow due to DC home today.     Final next level of care: Home/Self Care Barriers to Discharge: Barriers Resolved   Patient Goals and CMS Choice        Discharge Placement                Patient to be transferred to facility by: By wife via car Name of family member notified: Wife Patient and family notified of of transfer: 06/23/19  Discharge Plan and Services                DME Arranged: Oxygen DME Agency: [employer] Date DME Agency Contacted: 06/22/19 Time DME Agency Contacted: 2000 Representative spoke with at DME Agency: brad            Social Determinants of Health (SDOH) Interventions     Readmission Risk Interventions No flowsheet data found.

## 2019-06-23 NOTE — Discharge Instructions (Signed)
COVID-19 COVID-19 is a respiratory infection that is caused by a virus called severe acute respiratory syndrome coronavirus 2 (SARS-CoV-2). The disease is also known as coronavirus disease or novel coronavirus. In some people, the virus may not cause any symptoms. In others, it may cause a serious infection. The infection can get worse quickly and can lead to complications, such as:  Pneumonia, or infection of the lungs.  Acute respiratory distress syndrome or ARDS. This is a condition in which fluid build-up in the lungs prevents the lungs from filling with air and passing oxygen into the blood.  Acute respiratory failure. This is a condition in which there is not enough oxygen passing from the lungs to the body or when carbon dioxide is not passing from the lungs out of the body.  Sepsis or septic shock. This is a serious bodily reaction to an infection.  Blood clotting problems.  Secondary infections due to bacteria or fungus.  Organ failure. This is when your body's organs stop working. The virus that causes COVID-19 is contagious. This means that it can spread from person to person through droplets from coughs and sneezes (respiratory secretions). What are the causes? This illness is caused by a virus. You may catch the virus by:  Breathing in droplets from an infected person. Droplets can be spread by a person breathing, speaking, singing, coughing, or sneezing.  Touching something, like a table or a doorknob, that was exposed to the virus (contaminated) and then touching your mouth, nose, or eyes. What increases the risk? Risk for infection You are more likely to be infected with this virus if you:  Are within 6 feet (2 meters) of a person with COVID-19.  Provide care for or live with a person who is infected with COVID-19.  Spend time in crowded indoor spaces or live in shared housing. Risk for serious illness You are more likely to become seriously ill from the virus if  you:  Are 50 years of age or older. The higher your age, the more you are at risk for serious illness.  Live in a nursing home or long-term care facility.  Have cancer.  Have a long-term (chronic) disease such as: ? Chronic lung disease, including chronic obstructive pulmonary disease or asthma. ? A long-term disease that lowers your body's ability to fight infection (immunocompromised). ? Heart disease, including heart failure, a condition in which the arteries that lead to the heart become narrow or blocked (coronary artery disease), a disease which makes the heart muscle thick, weak, or stiff (cardiomyopathy). ? Diabetes. ? Chronic kidney disease. ? Sickle cell disease, a condition in which red blood cells have an abnormal "sickle" shape. ? Liver disease.  Are obese. What are the signs or symptoms? Symptoms of this condition can range from mild to severe. Symptoms may appear any time from 2 to 14 days after being exposed to the virus. They include:  A fever or chills.  A cough.  Difficulty breathing.  Headaches, body aches, or muscle aches.  Runny or stuffy (congested) nose.  A sore throat.  New loss of taste or smell. Some people may also have stomach problems, such as nausea, vomiting, or diarrhea. Other people may not have any symptoms of COVID-19. How is this diagnosed? This condition may be diagnosed based on:  Your signs and symptoms, especially if: ? You live in an area with a COVID-19 outbreak. ? You recently traveled to or from an area where the virus is common. ? You   provide care for or live with a person who was diagnosed with COVID-19. ? You were exposed to a person who was diagnosed with COVID-19.  A physical exam.  Lab tests, which may include: ? Taking a sample of fluid from the back of your nose and throat (nasopharyngeal fluid), your nose, or your throat using a swab. ? A sample of mucus from your lungs (sputum). ? Blood tests.  Imaging tests,  which may include, X-rays, CT scan, or ultrasound. How is this treated? At present, there is no medicine to treat COVID-19. Medicines that treat other diseases are being used on a trial basis to see if they are effective against COVID-19. Your health care provider will talk with you about ways to treat your symptoms. For most people, the infection is mild and can be managed at home with rest, fluids, and over-the-counter medicines. Treatment for a serious infection usually takes places in a hospital intensive care unit (ICU). It may include one or more of the following treatments. These treatments are given until your symptoms improve.  Receiving fluids and medicines through an IV.  Supplemental oxygen. Extra oxygen is given through a tube in the nose, a face mask, or a hood.  Positioning you to lie on your stomach (prone position). This makes it easier for oxygen to get into the lungs.  Continuous positive airway pressure (CPAP) or bi-level positive airway pressure (BPAP) machine. This treatment uses mild air pressure to keep the airways open. A tube that is connected to a motor delivers oxygen to the body.  Ventilator. This treatment moves air into and out of the lungs by using a tube that is placed in your windpipe.  Tracheostomy. This is a procedure to create a hole in the neck so that a breathing tube can be inserted.  Extracorporeal membrane oxygenation (ECMO). This procedure gives the lungs a chance to recover by taking over the functions of the heart and lungs. It supplies oxygen to the body and removes carbon dioxide. Follow these instructions at home: Lifestyle  If you are sick, stay home except to get medical care. Your health care provider will tell you how long to stay home. Call your health care provider before you go for medical care.  Rest at home as told by your health care provider.  Do not use any products that contain nicotine or tobacco, such as cigarettes,  e-cigarettes, and chewing tobacco. If you need help quitting, ask your health care provider.  Return to your normal activities as told by your health care provider. Ask your health care provider what activities are safe for you. General instructions  Take over-the-counter and prescription medicines only as told by your health care provider.  Drink enough fluid to keep your urine pale yellow.  Keep all follow-up visits as told by your health care provider. This is important. How is this prevented?  There is no vaccine to help prevent COVID-19 infection. However, there are steps you can take to protect yourself and others from this virus. To protect yourself:   Do not travel to areas where COVID-19 is a risk. The areas where COVID-19 is reported change often. To identify high-risk areas and travel restrictions, check the CDC travel website: wwwnc.cdc.gov/travel/notices  If you live in, or must travel to, an area where COVID-19 is a risk, take precautions to avoid infection. ? Stay away from people who are sick. ? Wash your hands often with soap and water for 20 seconds. If soap and water   are not available, use an alcohol-based hand sanitizer. ? Avoid touching your mouth, face, eyes, or nose. ? Avoid going out in public, follow guidance from your state and local health authorities. ? If you must go out in public, wear a cloth face covering or face mask. Make sure your mask covers your nose and mouth. ? Avoid crowded indoor spaces. Stay at least 6 feet (2 meters) away from others. ? Disinfect objects and surfaces that are frequently touched every day. This may include:  Counters and tables.  Doorknobs and light switches.  Sinks and faucets.  Electronics, such as phones, remote controls, keyboards, computers, and tablets. To protect others: If you have symptoms of COVID-19, take steps to prevent the virus from spreading to others.  If you think you have a COVID-19 infection, contact  your health care provider right away. Tell your health care team that you think you may have a COVID-19 infection.  Stay home. Leave your house only to seek medical care. Do not use public transport.  Do not travel while you are sick.  Wash your hands often with soap and water for 20 seconds. If soap and water are not available, use alcohol-based hand sanitizer.  Stay away from other members of your household. Let healthy household members care for children and pets, if possible. If you have to care for children or pets, wash your hands often and wear a mask. If possible, stay in your own room, separate from others. Use a different bathroom.  Make sure that all people in your household wash their hands well and often.  Cough or sneeze into a tissue or your sleeve or elbow. Do not cough or sneeze into your hand or into the air.  Wear a cloth face covering or face mask. Make sure your mask covers your nose and mouth. Where to find more information  Centers for Disease Control and Prevention: www.cdc.gov/coronavirus/2019-ncov/index.html  World Health Organization: www.who.int/health-topics/coronavirus Contact a health care provider if:  You live in or have traveled to an area where COVID-19 is a risk and you have symptoms of the infection.  You have had contact with someone who has COVID-19 and you have symptoms of the infection. Get help right away if:  You have trouble breathing.  You have pain or pressure in your chest.  You have confusion.  You have bluish lips and fingernails.  You have difficulty waking from sleep.  You have symptoms that get worse. These symptoms may represent a serious problem that is an emergency. Do not wait to see if the symptoms will go away. Get medical help right away. Call your local emergency services (911 in the [place] Do not drive yourself to the hospital. Let the emergency medical personnel know if you think you have  COVID-19. Summary  COVID-19 is a respiratory infection that is caused by a virus. It is also known as coronavirus disease or novel coronavirus. It can cause serious infections, such as pneumonia, acute respiratory distress syndrome, acute respiratory failure, or sepsis.  The virus that causes COVID-19 is contagious. This means that it can spread from person to person through droplets from breathing, speaking, singing, coughing, or sneezing.  You are more likely to develop a serious illness if you are 50 years of age or older, have a weak immune system, live in a nursing home, or have chronic disease.  There is no medicine to treat COVID-19. Your health care provider will talk with you about ways to treat your symptoms.    Take steps to protect yourself and others from infection. Wash your hands often and disinfect objects and surfaces that are frequently touched every day. Stay away from people who are sick and wear a mask if you are sick. This information is not intended to replace advice given to you by your health care provider. Make sure you discuss any questions you have with your health care provider. Document Revised: 12/23/2018 Document Reviewed: 03/31/2018 Elsevier Patient Education  2020 [employer].   COVID-19 Frequently Asked Questions COVID-19 (coronavirus disease) is an infection that is caused by a large family of viruses. Some viruses cause illness in people and others cause illness in animals like camels, cats, and bats. In some cases, the viruses that cause illness in animals can spread to humans. Where did the coronavirus come from? In December 2019, [place] told the [facility]) of several cases of lung disease (human respiratory illness). These cases were linked to an open seafood and livestock market in the city of [place]. The link to the seafood and livestock market suggests that the virus may have spread from animals to humans. However, since that first  outbreak in December, the virus has also been shown to spread from person to person. What is the name of the disease and the virus? Disease name Early on, this disease was called novel coronavirus. This is because scientists determined that the disease was caused by a new (novel) respiratory virus. The World Health Organization [facility]) has now named the disease COVID-19, or coronavirus disease. Virus name The virus that causes the disease is called severe acute respiratory syndrome coronavirus 2 (SARS-CoV-2). More information on disease and virus naming World Health Organization [facility]): www.who.int/emergencies/diseases/novel-coronavirus-2019/technical-guidance/naming-the-coronavirus-disease-(covid-2019)-and-the-virus-that-causes-it Who is at risk for complications from coronavirus disease? Some people may be at higher risk for complications from coronavirus disease. This includes older adults and people who have chronic diseases, such as heart disease, diabetes, and lung disease. If you are at higher risk for complications, take these extra precautions:  Stay home as much as possible.  Avoid social gatherings and travel.  Avoid close contact with others. Stay at least 6 ft (2 m) away from others, if possible.  Wash your hands often with soap and water for at least 20 seconds.  Avoid touching your face, mouth, nose, or eyes.  Keep supplies on hand at home, such as food, medicine, and cleaning supplies.  If you must go out in public, wear a cloth face covering or face mask. Make sure your mask covers your nose and mouth. How does coronavirus disease spread? The virus that causes coronavirus disease spreads easily from person to person (is contagious). You may catch the virus by:  Breathing in droplets from an infected person. Droplets can be spread by a person breathing, speaking, singing, coughing, or sneezing.  Touching something, like a table or a doorknob, that was exposed to the virus  (contaminated) and then touching your mouth, nose, or eyes. Can I get the virus from touching surfaces or objects? There is still a lot that we do not know about the virus that causes coronavirus disease. Scientists are basing a lot of information on what they know about similar viruses, such as:  Viruses cannot generally survive on surfaces for long. They need a human body (host) to survive.  It is more likely that the virus is spread by close contact with people who are sick (direct contact), such as through: ? Shaking hands or hugging. ? Breathing in respiratory droplets  that travel through the air. Droplets can be spread by a person breathing, speaking, singing, coughing, or sneezing.  It is less likely that the virus is spread when a person touches a surface or object that has the virus on it (indirect contact). The virus may be able to enter the body if the person touches a surface or object and then touches his or her face, eyes, nose, or mouth. Can a person spread the virus without having symptoms of the disease? It may be possible for the virus to spread before a person has symptoms of the disease, but this is most likely not the main way the virus is spreading. It is more likely for the virus to spread by being in close contact with people who are sick and breathing in the respiratory droplets spread by a person breathing, speaking, singing, coughing, or sneezing. What are the symptoms of coronavirus disease? Symptoms vary from person to person and can range from mild to severe. Symptoms may include:  Fever or chills.  Cough.  Difficulty breathing or feeling short of breath.  Headaches, body aches, or muscle aches.  Runny or stuffy (congested) nose.  Sore throat.  New loss of taste or smell.  Nausea, vomiting, or diarrhea. These symptoms can appear anywhere from 2 to 14 days after you have been exposed to the virus. Some people may not have any symptoms. If you develop  symptoms, call your health care provider. People with severe symptoms may need hospital care. Should I be tested for this virus? Your health care provider will decide whether to test you based on your symptoms, history of exposure, and your risk factors. How does a health care provider test for this virus? Health care providers will collect samples to send for testing. Samples may include:  Taking a swab of fluid from the back of your nose and throat, your nose, or your throat.  Taking fluid from the lungs by having you cough up mucus (sputum) into a sterile cup.  Taking a blood sample. Is there a treatment or vaccine for this virus? Currently, there is no vaccine to prevent coronavirus disease. Also, there are no medicines like antibiotics or antivirals to treat the virus. A person who becomes sick is given supportive care, which means rest and fluids. A person may also relieve his or her symptoms by using over-the-counter medicines that treat sneezing, coughing, and runny nose. These are the same medicines that a person takes for the common cold. If you develop symptoms, call your health care provider. People with severe symptoms may need hospital care. What can I do to protect myself and my family from this virus?     You can protect yourself and your family by taking the same actions that you would take to prevent the spread of other viruses. Take the following actions:  Wash your hands often with soap and water for at least 20 seconds. If soap and water are not available, use alcohol-based hand sanitizer.  Avoid touching your face, mouth, nose, or eyes.  Cough or sneeze into a tissue, sleeve, or elbow. Do not cough or sneeze into your hand or the air. ? If you cough or sneeze into a tissue, throw it away immediately and wash your hands.  Disinfect objects and surfaces that you frequently touch every day.  Stay away from people who are sick.  Avoid going out in public, follow  guidance from your state and local health authorities.  Avoid crowded indoor spaces.   Stay at least 6 ft (2 m) away from others.  If you must go out in public, wear a cloth face covering or face mask. Make sure your mask covers your nose and mouth.  Stay home if you are sick, except to get medical care. Call your health care provider before you get medical care. Your health care provider will tell you how long to stay home.  Make sure your vaccines are up to date. Ask your health care provider what vaccines you need. What should I do if I need to travel? Follow travel recommendations from your local health authority, the CDC, and WHO. Travel information and advice  Centers for Disease Control and Prevention (CDC): BodyEditor.hu  World Health Organization [facility]): ThirdIncome.ca Know the risks and take action to protect your health  You are at higher risk of getting coronavirus disease if you are traveling to areas with an outbreak or if you are exposed to travelers from areas with an outbreak.  Wash your hands often and practice good hygiene to lower the risk of catching or spreading the virus. What should I do if I am sick? General instructions to stop the spread of infection  Wash your hands often with soap and water for at least 20 seconds. If soap and water are not available, use alcohol-based hand sanitizer.  Cough or sneeze into a tissue, sleeve, or elbow. Do not cough or sneeze into your hand or the air.  If you cough or sneeze into a tissue, throw it away immediately and wash your hands.  Stay home unless you must get medical care. Call your health care provider or local health authority before you get medical care.  Avoid public areas. Do not take public transportation, if possible.  If you can, wear a mask if you must go out of the house or if you are in close contact with someone  who is not sick. Make sure your mask covers your nose and mouth. Keep your home clean  Disinfect objects and surfaces that are frequently touched every day. This may include: ? Counters and tables. ? Doorknobs and light switches. ? Sinks and faucets. ? Electronics such as phones, remote controls, keyboards, computers, and tablets.  Wash dishes in hot, soapy water or use a dishwasher. Air-dry your dishes.  Wash laundry in hot water. Prevent infecting other household members  Let healthy household members care for children and pets, if possible. If you have to care for children or pets, wash your hands often and wear a mask.  Sleep in a different bedroom or bed, if possible.  Do not share personal items, such as razors, toothbrushes, deodorant, combs, brushes, towels, and washcloths. Where to find more information Centers for Disease Control and Prevention (CDC)  Information and news updates: https://www.butler-gonzalez.com/ World Health Organization [facility])  Information and news updates: MissExecutive.com.ee  Coronavirus health topic: https://www.castaneda.info/  Questions and answers on COVID-19: OpportunityDebt.at  Global tracker: who.sprinklr.com American Academy of Pediatrics (AAP)  Information for families: www.healthychildren.org/English/health-issues/conditions/chest-lungs/Pages/2019-Novel-Coronavirus.aspx The coronavirus situation is changing rapidly. Check your local health authority website or the CDC and [facility] websites for updates and news. When should I contact a health care provider?  Contact your health care provider if you have symptoms of an infection, such as fever or cough, and you: ? Have been near anyone who is known to have coronavirus disease. ? Have come into contact with a person who is suspected to have coronavirus disease. ? Have traveled to an area where there is  an outbreak of  COVID-19. When should I get emergency medical care?  Get help right away by calling your local emergency services (911 in the [place] if you have: ? Trouble breathing. ? Pain or pressure in your chest. ? Confusion. ? Blue-tinged lips and fingernails. ? Difficulty waking from sleep. ? Symptoms that get worse. Let the emergency medical personnel know if you think you have coronavirus disease. Summary  A new respiratory virus is spreading from person to person and causing COVID-19 (coronavirus disease).  The virus that causes COVID-19 appears to spread easily. It spreads from one person to another through droplets from breathing, speaking, singing, coughing, or sneezing.  Older adults and those with chronic diseases are at higher risk of disease. If you are at higher risk for complications, take extra precautions.  There is currently no vaccine to prevent coronavirus disease. There are no medicines, such as antibiotics or antivirals, to treat the virus.  You can protect yourself and your family by washing your hands often, avoiding touching your face, and covering your coughs and sneezes. This information is not intended to replace advice given to you by your health care provider. Make sure you discuss any questions you have with your health care provider. Document Revised: 12/23/2018 Document Reviewed: 06/21/2018 Elsevier Patient Education  2020 [employer].  COVID-19: How to Protect Yourself and Others Know how it spreads  There is currently no vaccine to prevent coronavirus disease 2019 (COVID-19).  The best way to prevent illness is to avoid being exposed to this virus.  The virus is thought to spread mainly from person-to-person. ? Between people who are in close contact with one another (within about 6 feet). ? Through respiratory droplets produced when an infected person coughs, sneezes or talks. ? These droplets can land in the mouths or noses of people who are nearby or  possibly be inhaled into the lungs. ? COVID-19 may be spread by people who are not showing symptoms. Everyone should Clean your hands often  Wash your hands often with soap and water for at least 20 seconds especially after you have been in a public place, or after blowing your nose, coughing, or sneezing.  If soap and water are not readily available, use a hand sanitizer that contains at least 60% alcohol. Cover all surfaces of your hands and rub them together until they feel dry.  Avoid touching your eyes, nose, and mouth with unwashed hands. Avoid close contact  Limit contact with others as much as possible.  Avoid close contact with people who are sick.  Put distance between yourself and other people. ? Remember that some people without symptoms may be able to spread virus. ? This is especially important for people who are at higher risk of getting very sick.www.cdc.gov/coronavirus/2019-ncov/need-extra-precautions/people-at-higher-risk.html Cover your mouth and nose with a mask when around others  You could spread COVID-19 to others even if you do not feel sick.  Everyone should wear a mask in public settings and when around people not living in their household, especially when social distancing is difficult to maintain. ? Masks should not be placed on young children under age 2, anyone who has trouble breathing, or is unconscious, incapacitated or otherwise unable to remove the mask without assistance.  The mask is meant to protect other people in case you are infected.  Do NOT use a facemask meant for a healthcare worker.  Continue to keep about 6 feet between yourself and others. The mask is not a substitute   for social distancing. Cover coughs and sneezes  Always cover your mouth and nose with a tissue when you cough or sneeze or use the inside of your elbow.  Throw used tissues in the trash.  Immediately wash your hands with soap and water for at least 20 seconds. If soap  and water are not readily available, clean your hands with a hand sanitizer that contains at least 60% alcohol. Clean and disinfect  Clean AND disinfect frequently touched surfaces daily. This includes tables, doorknobs, light switches, countertops, handles, desks, phones, keyboards, toilets, faucets, and sinks. ktimeonline.com  If surfaces are dirty, clean them: Use detergent or soap and water prior to disinfection.  Then, use a household disinfectant. You can see a list of EPA-registered household disinfectants here. [email or web address].uk 11/09/2018 This information is not intended to replace advice given to you by your health care provider. Make sure you discuss any questions you have with your health care provider. Document Revised: 11/17/2018 Document Reviewed: 09/15/2018 Elsevier Patient Education  2020 Elsevier Inc.  10 Things You Can Do to Manage Your COVID-19 Symptoms at Home If you have possible or confirmed COVID-19: 1. Stay home from work and school. And stay away from other public places. If you must go out, avoid using any kind of public transportation, ridesharing, or taxis. 2. Monitor your symptoms carefully. If your symptoms get worse, call your healthcare provider immediately. 3. Get rest and stay hydrated. 4. If you have a medical appointment, call the healthcare provider ahead of time and tell them that you have or may have COVID-19. 5. For medical emergencies, call 911 and notify the dispatch personnel that you have or may have COVID-19. 6. Cover your cough and sneezes with a tissue or use the inside of your elbow. 7. Wash your hands often with soap and water for at least 20 seconds or clean your hands with an alcohol-based hand sanitizer that contains at least 60% alcohol. 8. As much as possible, stay in a specific room and away from other people in your home. Also, you should use a separate bathroom, if  available. If you need to be around other people in or outside of the home, wear a mask. 9. Avoid sharing personal items with other people in your household, like dishes, towels, and bedding. 10. Clean all surfaces that are touched often, like counters, tabletops, and doorknobs. Use household cleaning sprays or wipes according to the label instructions. [email or web address].co.uk 09/07/2018 This information is not intended to replace advice given to you by your health care provider. Make sure you discuss any questions you have with your health care provider. Document Revised: 02/09/2019 Document Reviewed: 02/09/2019 Elsevier Patient Education  2020 Elsevier Inc.    Person Under Monitoring Name: Jared Craig  Location: [address] [place] [address]   CORONAVIRUS DISEASE 2019 (COVID-19) Guidance for Persons Under Investigation You are being tested for the virus that causes coronavirus disease 2019 (COVID-19). Public health actions are necessary to ensure protection of your health and the health of others, and to prevent further spread of infection. COVID-19 is caused by a virus that can cause symptoms, such as fever, cough, and shortness of breath. The primary transmission from person to person is by coughing or sneezing. On April 07, 2018, the World Health Organization announced a [employer] Emergency of International Concern and on April 08, 2018 the U.S. Department of Health and Human Services declared a public health emergency. If the virus that causesCOVID-19 spreads in the community, it  could have severe public health consequences.  As a person under investigation for COVID-19, the [place] advises you to adhere to the following guidance until your test results are reported to you. If your test result is positive, you will receive additional information from your provider and your local health department at that  time.   Remain at home until you are cleared by your health provider or public health authorities.   Keep a log of visitors to your home using the form provided. Any visitors to your home must be aware of your isolation status.  If you plan to move to a new address or leave the county, notify the local health department in your county.  Call a doctor or seek care if you have an urgent medical need. Before seeking medical care, call ahead and get instructions from the provider before arriving at the medical office, clinic or hospital. Notify them that you are being tested for the virus that causes COVID-19 so arrangements can be made, as necessary, to prevent transmission to others in the healthcare setting. Next, notify the local health department in your county.  If a medical emergency arises and you need to call 911, inform the first responders that you are being tested for the virus that causes COVID-19. Next, notify the local health department in your county.  Adhere to all guidance set forth by the [facility] for [facility] of patients that is based on guidance from the Center for Disease Control and Prevention with suspected or confirmed COVID-19. It is provided with this guidance for Persons Under Investigation.  Your health and the health of our community are our top priorities. Public Health officials remain available to provide assistance and counseling to you about COVID-19 and compliance with this guidance.  Provider: ____________________________________________________________ Date: ______/_____/_________  By signing below, you acknowledge that you have read and agree to comply with this Guidance for Persons Under Investigation. ______________________________________________________________ Date: ______/_____/_________  WHO DO I CALL? You can find a list of local health departments here:  [email or web address] Health Department: ____________________________________________________________________ Contact Name: ________________________________________________________________________ Telephone: ___________________________________________________________________________  Marice Potter, [place], Communicable Disease Branch COVID-19 Guidance for Persons Under Investigation May 14, 2018   Person Under Monitoring Name: Jared Craig  Location: [address] [place] [address]   Infection Prevention Recommendations for Individuals Confirmed to have, or Being Evaluated for, 2019 Novel Coronavirus (COVID-19) Infection Who Receive Care at Home  Individuals who are confirmed to have, or are being evaluated for, COVID-19 should follow the prevention steps below until a healthcare provider or local or state health department says they can return to normal activities.  Stay home except to get medical care You should restrict activities outside your home, except for getting medical care. Do not go to work, school, or public areas, and do not use public transportation or taxis.  Call ahead before visiting your doctor Before your medical appointment, call the healthcare provider and tell them that you have, or are being evaluated for, COVID-19 infection. This will help the healthcare provider's office take steps to keep other people from getting infected. Ask your healthcare provider to call the local or state health department.  Monitor your symptoms Seek prompt medical attention if your illness is worsening (e.g., difficulty breathing). Before going to your medical appointment, call the healthcare provider and tell them that you have, or are being evaluated  for, COVID-19 infection. Ask your healthcare provider to call the local or state health department.  Wear a facemask You should wear a facemask that  covers your nose and mouth when you are in the same room with other people and when you visit a healthcare provider. People who live with or visit you should also wear a facemask while they are in the same room with you.  Separate yourself from other people in your home As much as possible, you should stay in a different room from other people in your home. Also, you should use a separate bathroom, if available.  Avoid sharing household items You should not share dishes, drinking glasses, cups, eating utensils, towels, bedding, or other items with other people in your home. After using these items, you should wash them thoroughly with soap and water.  Cover your coughs and sneezes Cover your mouth and nose with a tissue when you cough or sneeze, or you can cough or sneeze into your sleeve. Throw used tissues in a lined trash can, and immediately wash your hands with soap and water for at least 20 seconds or use an alcohol-based hand rub.  Wash your [employer] your hands often and thoroughly with soap and water for at least 20 seconds. You can use an alcohol-based hand sanitizer if soap and water are not available and if your hands are not visibly dirty. Avoid touching your eyes, nose, and mouth with unwashed hands.   Prevention Steps for Caregivers and Household Members of Individuals Confirmed to have, or Being Evaluated for, COVID-19 Infection Being Cared for in the Home  If you live with, or provide care at home for, a person confirmed to have, or being evaluated for, COVID-19 infection please follow these guidelines to prevent infection:  Follow healthcare provider's instructions Make sure that you understand and can help the patient follow any healthcare provider instructions for all care.  Provide for the patient's basic needs You should help the patient with basic needs in the home and provide support for getting groceries, prescriptions, and other personal needs.  Monitor  the patient's symptoms If they are getting sicker, call his or her medical provider and tell them that the patient has, or is being evaluated for, COVID-19 infection. This will help the healthcare provider's office take steps to keep other people from getting infected. Ask the healthcare provider to call the local or state health department.  Limit the number of people who have contact with the patient  If possible, have only one caregiver for the patient.  Other household members should stay in another home or place of residence. If this is not possible, they should stay  in another room, or be separated from the patient as much as possible. Use a separate bathroom, if available.  Restrict visitors who do not have an essential need to be in the home.  Keep older adults, very young children, and other sick people away from the patient Keep older adults, very young children, and those who have compromised immune systems or chronic health conditions away from the patient. This includes people with chronic heart, lung, or kidney conditions, diabetes, and cancer.  Ensure good ventilation Make sure that shared spaces in the home have good air flow, such as from an air conditioner or an opened window, weather permitting.  Wash your hands often  Wash your hands often and thoroughly with soap and water for at least 20 seconds. You can use an alcohol based hand sanitizer  if soap and water are not available and if your hands are not visibly dirty.  Avoid touching your eyes, nose, and mouth with unwashed hands.  Use disposable paper towels to dry your hands. If not available, use dedicated cloth towels and replace them when they become wet.  Wear a facemask and gloves  Wear a disposable facemask at all times in the room and gloves when you touch or have contact with the patient's blood, body fluids, and/or secretions or excretions, such as sweat, saliva, sputum, nasal mucus, vomit, urine, or  feces.  Ensure the mask fits over your nose and mouth tightly, and do not touch it during use.  Throw out disposable facemasks and gloves after using them. Do not reuse.  Wash your hands immediately after removing your facemask and gloves.  If your personal clothing becomes contaminated, carefully remove clothing and launder. Wash your hands after handling contaminated clothing.  Place all used disposable facemasks, gloves, and other waste in a lined container before disposing them with other household waste.  Remove gloves and wash your hands immediately after handling these items.  Do not share dishes, glasses, or other household items with the patient  Avoid sharing household items. You should not share dishes, drinking glasses, cups, eating utensils, towels, bedding, or other items with a patient who is confirmed to have, or being evaluated for, COVID-19 infection.  After the person uses these items, you should wash them thoroughly with soap and water.  Wash laundry thoroughly  Immediately remove and wash clothes or bedding that have blood, body fluids, and/or secretions or excretions, such as sweat, saliva, sputum, nasal mucus, vomit, urine, or feces, on them.  Wear gloves when handling laundry from the patient.  Read and follow directions on labels of laundry or clothing items and detergent. In general, wash and dry with the warmest temperatures recommended on the label.  Clean all areas the individual has used often  Clean all touchable surfaces, such as counters, tabletops, doorknobs, bathroom fixtures, toilets, phones, keyboards, tablets, and bedside tables, every day. Also, clean any surfaces that may have blood, body fluids, and/or secretions or excretions on them.  Wear gloves when cleaning surfaces the patient has come in contact with.  Use a diluted bleach solution (e.g., dilute bleach with 1 part bleach and 10 parts water) or a household disinfectant with a label that  says EPA-registered for coronaviruses. To make a bleach solution at home, add 1 tablespoon of bleach to 1 quart (4 cups) of water. For a larger supply, add  cup of bleach to 1 gallon (16 cups) of water.  Read labels of cleaning products and follow recommendations provided on product labels. Labels contain instructions for safe and effective use of the cleaning product including precautions you should take when applying the product, such as wearing gloves or eye protection and making sure you have good ventilation during use of the product.  Remove gloves and wash hands immediately after cleaning.  Monitor yourself for signs and symptoms of illness Caregivers and household members are considered close contacts, should monitor their health, and will be asked to limit movement outside of the home to the extent possible. Follow the monitoring steps for close contacts listed on the symptom monitoring form.   ? If you have additional questions, contact your local health department or call the epidemiologist on call at [phone] (available 24/7). ? This guidance is subject to change. For the most up-to-date guidance from [facility], please refer to their website:  TripMetro.huhttps://www.cdc.gov/coronavirus/2019-ncov/hcp/guidance-prevent-spread.html

## 2019-06-24 NOTE — Discharge Summary (Signed)
[facility] [place] NAME: Jared Craig    MR#:  [id number]  DATE OF BIRTH:  [birth year]  DATE OF ADMISSION:  06/18/2019   ADMITTING PHYSICIAN: Christel Mormon, MD  DATE OF DISCHARGE: 06/23/2019  1:47 PM  PRIMARY CARE PHYSICIAN: Chrismon, Vickki Muff, PA   ADMISSION DIAGNOSIS:  Acute respiratory failure with hypoxia (HCC) [J96.01] Acute hypoxemic respiratory failure due to COVID-19 ([place]) [U07.1, J96.01] Pneumonia due to COVID-19 virus [U07.1, J12.82] DISCHARGE DIAGNOSIS:  Active Problems:   Pneumonia due to COVID-19 virus   Acute respiratory failure with hypoxia ([place])  SECONDARY DIAGNOSIS:   Past Medical History:  Diagnosis Date  . Heart murmur    as a child   HOSPITAL COURSE:  Jared Craig  is a 44 y.o. Caucasian male with a known history of severe obesity admitted for fever with associated  body aches and cough that started about 4 days ago.  He tested positive for COVID-19 on Tuesday after being exposed to his brother who also tested positive.  His symptoms started on Wednesday.  Earlier on the morning of admission he complained of dyspnea and chest pain felt as numbness with radiation to his arms without  palpitations.  No nausea or vomiting but he has been having diarrhea with no abdominal pain. He admitted to loss of taste and smell as well as generalized weakness and fatigue.    While in the ED, vital signs were within normal except for blood pressure of 141/84 and pulse oximetry of 90% on room air that was up to 95% on 2 L of O2 by nasal cannula.  Labs revealed unremarkable CMP except for mild hyponatremia and hypochloremia.  LDH was elevated at 354 and ferritin 1030 with procalcitonin of 0.2.  High-sensitivity troponin I was 12 twice and CBC was unremarkable.  Fibrin derivatives D-dimer was 1052.48 with fibrinogen of 651. UA was unremarkable.  Chest x-ray showed mild diffuse bilateral infiltrates.  Chest CTA revealed moderate to marked severe  diffuse bilateral infiltrates with mild bilateral hilar lymphadenopathy, right greater than left and no evidence for PE.  1. Acute hypoxemic respiratory failure secondary to COVID-19. -Completed course of remdesivir and treated with steroids.  He will need 2 L oxygen at discharge.  2. Multifocal pneumonia secondary to COVID-19.  3. Hyponatremia and hypochloremia. -resolved with hydration.  4. Elevated LFTs. -Due to COVID-19. Improved  DISCHARGE CONDITIONS:  Stable CONSULTS OBTAINED:   DRUG ALLERGIES:  No Known Allergies DISCHARGE MEDICATIONS:   Allergies as of 06/23/2019   No Known Allergies     Medication List    TAKE these medications   ascorbic acid 500 MG tablet Commonly known as: VITAMIN C Take 1 tablet (500 mg total) by mouth daily.   predniSONE 10 MG (21) Tbpk tablet Commonly known as: STERAPRED UNI-PAK 21 TAB Start 60 mg p.o. daily, taper 10 mg daily until done   zinc sulfate 220 (50 Zn) MG capsule Take 1 capsule (220 mg total) by mouth daily.      DISCHARGE INSTRUCTIONS:   DIET:  Regular diet DISCHARGE CONDITION:  Stable ACTIVITY:  Activity as tolerated OXYGEN:  Home Oxygen: Yes.    Oxygen Delivery: 2 liters/min via Patient connected to nasal cannula oxygen DISCHARGE LOCATION:  home   If you experience worsening of your admission symptoms, develop shortness of breath, life threatening emergency, suicidal or homicidal thoughts you must seek medical attention immediately by calling 911 or calling your  MD immediately  if symptoms less severe.  You Must read complete instructions/literature along with all the possible adverse reactions/side effects for all the Medicines you take and that have been prescribed to you. Take any new Medicines after you have completely understood and accpet all the possible adverse reactions/side effects.   Please note  You were cared for by a hospitalist during your hospital stay. If you have any questions about  your discharge medications or the care you received while you were in the hospital after you are discharged, you can call the unit and asked to speak with the hospitalist on call if the hospitalist that took care of you is not available. Once you are discharged, your primary care physician will handle any further medical issues. Please note that NO REFILLS for any discharge medications will be authorized once you are discharged, as it is imperative that you return to your primary care physician (or establish a relationship with a primary care physician if you do not have one) for your aftercare needs so that they can reassess your need for medications and monitor your lab values.    On the day of Discharge:  VITAL SIGNS:  Blood pressure (!) 131/92, pulse (!) 55, temperature 98.6 F (37 C), temperature source Oral, resp. rate 15, height 5\' 6"  (1.676 m), weight 136.1 kg, SpO2 93 %. PHYSICAL EXAMINATION:  GENERAL:  44 y.o.-year-old patient lying in the bed with no acute distress.  EYES: Pupils equal, round, reactive to light and accommodation. No scleral icterus. Extraocular muscles intact.  HEENT: Head atraumatic, normocephalic. Oropharynx and nasopharynx clear.  NECK:  Supple, no jugular venous distention. No thyroid enlargement, no tenderness.  LUNGS: Normal breath sounds bilaterally, no wheezing, rales,rhonchi or crepitation. No use of accessory muscles of respiration.  CARDIOVASCULAR: S1, S2 normal. No murmurs, rubs, or gallops.  ABDOMEN: Soft, non-tender, non-distended. Bowel sounds present. No organomegaly or mass.  EXTREMITIES: No pedal edema, cyanosis, or clubbing.  NEUROLOGIC: Cranial nerves II through XII are intact. Muscle strength 5/5 in all extremities. Sensation intact. Gait not checked.  PSYCHIATRIC: The patient is alert and oriented x 3.  SKIN: No obvious rash, lesion, or ulcer.  DATA REVIEW:   CBC Recent Labs  Lab 06/23/19 0625  WBC 11.5*  HGB 14.9  HCT 45.4  PLT 474*     Chemistries  Recent Labs  Lab 06/23/19 0625  NA 139  K 5.0  CL 101  CO2 28  GLUCOSE 177*  BUN 29*  CREATININE 0.82  CALCIUM 8.7*  AST 128*  ALT 157*  ALKPHOS 43  BILITOT 0.7     Outpatient follow-up Follow-up Information    Chrismon, 06/25/19, PA. Schedule an appointment as soon as possible for a visit in 4 day(s).   Specialty: Family Medicine Contact information: [address] [place] [phone]            Management plans discussed with the patient, family and they are in agreement.  CODE STATUS: Prior   TOTAL TIME TAKING CARE OF THIS PATIENT: 45 minutes.    [phone] M.D on 06/24/2019 at 3:41 PM  [facility]   CC: Primary care physician; 06/26/2019, PA   Note: This dictation was prepared with Dragon dictation along with smaller phrase technology. Any transcriptional errors that result from this process are unintentional.

## 2019-06-26 ENCOUNTER — Telehealth: Payer: Self-pay

## 2019-06-26 NOTE — Telephone Encounter (Signed)
I have made the 1st attempt to contact the patient or family member in charge, in order to follow up from recently being discharged from the hospital. I left a message on voicemail requesting a CB. -MM 

## 2019-06-26 NOTE — Telephone Encounter (Signed)
Transition Care Management Follow-up Telephone Call  Date of discharge and from where: [facility] on 06/23/19  How have you been since you were released from the hospital? Doing better, still has a lingering cough but it is better too. Pt is back at work today and has to wear a mask all day at work so he thinks that might be causing the cough. Pt has "jitters" in the morning after taking Prednisone. Pt is sleeping well at night and appetite is normal. Declines fever, SOB, fatigue, pain or n/v/d.   Any questions or concerns? No   Items Reviewed:  Did the pt receive and understand the discharge instructions provided? Yes   Medications obtained and verified? Yes   Any new allergies since your discharge? No   Dietary orders reviewed? Yes  Do you have support at home? Yes   Other (ie: DME, Home Health, etc) Oxygen was d/c for night time use only.  Functional Questionnaire: (I = Independent and D = Dependent)  Bathing/Dressing- I   Meal Prep- I  Eating- I  Maintaining continence- I  Transferring/Ambulation- I  Managing Meds- I   Follow up appointments reviewed:    PCP Hospital f/u appt confirmed? Yes  Scheduled to see Dortha Kern on 06/29/19 @ 2:40 PM.  Specialist Hospital f/u appt confirmed? N/A   Are transportation arrangements needed? No   If their condition worsens, is the pt aware to call  their PCP or go to the ED? Yes  Was the patient provided with contact information for the PCP's office or ED? Yes  Was the pt encouraged to call back with questions or concerns? Yes

## 2019-06-26 NOTE — Progress Notes (Signed)
Established patient visit    Patient: Jared Craig   DOB: 02-May-1975   44 y.o. Male  MRN: [id number] Visit Date: 06/29/2019  Today's healthcare provider: Dortha Kern, PA   Chief Complaint  Patient presents with  . Hospitalization Follow-up   Subjective    HPI   [employer] as a Neurosurgeon for [employer], [employer].,have documented all relevant documentation on the behalf of [employer], [employer],as directed by  [employer], PA while in the presence of [employer], [place].         Follow up Hospitalization:  Patient was admitted to [facility] on 06/18/2019 and discharged on 06/23/2019. He was treated for pneumonia due to COVID & acute respiratory failure with hypoxia. Treatment for this included started Zinc, Vitamin C & Prednisone taper. Telephone follow up was done on 06/26/2019 He reports good compliance with treatment. He reports this condition is resolved.  ----------------------------------------------------------------------------------------- -   Patient Active Problem List   Diagnosis Date Noted  . Acute respiratory failure with hypoxia (HCC)   . Pneumonia due to COVID-19 virus 06/19/2019  . Obesity 03/22/2017  . Elevated hemoglobin A1c 03/22/2017  . Elevated cholesterol with elevated triglycerides 03/22/2017   Past Medical History:  Diagnosis Date  . Heart murmur    as a child   Past Surgical History:  Procedure Laterality Date  . COLONOSCOPY     Social History   Socioeconomic History  . Marital status: Married    Spouse name: Not on file  . Number of children: Not on file  . Years of education: Not on file  . Highest education level: Not on file  Occupational History  . Not on file  Tobacco Use  . Smoking status: Never Smoker  . Smokeless tobacco: Current User    Types: Chew  Substance and Sexual Activity  . Alcohol use: Yes    Comment: occasionally   . Drug use: No  . Sexual activity: Not on file  Other Topics Concern  . Not  on file  Social History Narrative  . Not on file   Social Determinants of Health   Financial Resource Strain:   . Difficulty of Paying Living Expenses:   Food Insecurity:   . Worried About Programme researcher, broadcasting/film/video in the Last Year:   . Barista in the Last Year:   Transportation Needs:   . Freight forwarder (Medical):   Marland Kitchen Lack of Transportation (Non-Medical):   Physical Activity:   . Days of Exercise per Week:   . Minutes of Exercise per Session:   Stress:   . Feeling of Stress :   Social Connections:   . Frequency of Communication with Friends and Family:   . Frequency of Social Gatherings with Friends and Family:   . Attends Religious Services:   . Active Member of Clubs or Organizations:   . Attends Banker Meetings:   Marland Kitchen Marital Status:   Intimate Partner Violence:   . Fear of Current or Ex-Partner:   . Emotionally Abused:   Marland Kitchen Physically Abused:   . Sexually Abused:    Family History  Problem Relation Age of Onset  . COPD Mother   . Heart attack Father   . Colon cancer Maternal Grandmother   . Heart attack Paternal Grandmother    No Known Allergies     Medications: Outpatient Medications Prior to Visit  Medication Sig  . ascorbic acid (VITAMIN C) 500 MG tablet Take 1 tablet (500  mg total) by mouth daily.  . predniSONE (STERAPRED UNI-PAK 21 TAB) 10 MG (21) TBPK tablet Start 60 mg p.o. daily, taper 10 mg daily until done  . zinc sulfate 220 (50 Zn) MG capsule Take 1 capsule (220 mg total) by mouth daily.   No facility-administered medications prior to visit.    Review of Systems  Constitutional: Negative.   HENT: Positive for tinnitus.   Respiratory: Negative for cough, shortness of breath and wheezing.   Cardiovascular: Negative.   Musculoskeletal: Negative.     Last CBC Lab Results  Component Value Date   WBC 11.5 (H) 06/23/2019   HGB 14.9 06/23/2019   HCT 45.4 06/23/2019   MCV 86.0 06/23/2019   MCH 28.2 06/23/2019   RDW 12.7  06/23/2019   PLT 474 (H) 06/23/2019   Last metabolic panel Lab Results  Component Value Date   GLUCOSE 177 (H) 06/23/2019   NA 139 06/23/2019   K 5.0 06/23/2019   CL 101 06/23/2019   CO2 28 06/23/2019   BUN 29 (H) 06/23/2019   CREATININE 0.82 06/23/2019   GFRNONAA >60 06/23/2019   GFRAA >60 06/23/2019   CALCIUM 8.7 (L) 06/23/2019   PROT 6.6 06/23/2019   ALBUMIN 3.1 (L) 06/23/2019   LABGLOB 2.5 03/23/2017   AGRATIO 1.8 03/23/2017   BILITOT 0.7 06/23/2019   ALKPHOS 43 06/23/2019   AST 128 (H) 06/23/2019   ALT 157 (H) 06/23/2019   ANIONGAP 10 06/23/2019       Objective    BP (!) 143/82 (BP Location: Right Arm, Patient Position: Sitting, Cuff Size: Large)   Pulse 96   Temp (!) 96.8 F (36 C) (Temporal)   Wt 285 lb 12.8 oz (129.6 kg)   BMI 46.13 kg/m  BP Readings from Last 3 Encounters:  06/29/19 (!) 143/82  06/23/19 (!) 131/92  06/24/17 (!) 144/88   Wt Readings from Last 3 Encounters:  06/29/19 285 lb 12.8 oz (129.6 kg)  06/18/19 300 lb (136.1 kg)  06/24/17 270 lb 3.2 oz (122.6 kg)    Pulse oximetry at rest was 97% and 95% after walking for a minute or two. Pulse spiked to 121 with walking test.  Physical Exam Constitutional:      Appearance: Normal appearance. He is obese.  HENT:     Head: Normocephalic and atraumatic.     Mouth/Throat:     Pharynx: Oropharynx is clear.  Eyes:     Pupils: Pupils are equal, round, and reactive to light.  Cardiovascular:     Rate and Rhythm: Normal rate and regular rhythm.     Pulses: Normal pulses.     Heart sounds: Normal heart sounds.  Pulmonary:     Effort: Pulmonary effort is normal.     Breath sounds: Normal breath sounds.  Abdominal:     General: Bowel sounds are normal.     Palpations: Abdomen is soft.  Musculoskeletal:        General: Normal range of motion.  Skin:    General: Skin is warm and dry.  Neurological:     General: No focal deficit present.     Mental Status: He is alert and oriented to person,  place, and time.  Psychiatric:        Mood and Affect: Mood normal.        Behavior: Behavior normal.        Thought Content: Thought content normal.        Judgment: Judgment normal.  Assessment & Plan    1. Pneumonia due to COVID-19 virus Much improved. Patient is using home O2 at night. Patient feels he does not need O2 during the day. He is back to work as of 06/26/2019. Don't recommend COVID vaccine yet. Just finished the prednisone taper today.  Continue Zinc & Vitamin C for the next month. May use O2 at night. Has a questionable history of sleep apnea with snoring. VAH was in the process of evaluating but that was before having the COVID infection. No dyspnea and feels energy level is improved. No significant change in sense of taste or smell. Recheck prn.    Return if symptoms worsen or fail to improve.      Andres Shad, PA, have reviewed all documentation for this visit. The documentation on 06/29/19 for the exam, diagnosis, procedures, and orders are all accurate and complete.    Vernie Murders, [place] [phone] (phone) [phone] (fax)  [place]

## 2019-06-26 NOTE — Telephone Encounter (Signed)
No HFU scheduled at this time. 

## 2019-06-29 ENCOUNTER — Encounter: Payer: Self-pay | Admitting: Family Medicine

## 2019-06-29 ENCOUNTER — Ambulatory Visit (INDEPENDENT_AMBULATORY_CARE_PROVIDER_SITE_OTHER): Payer: Commercial Managed Care - PPO | Admitting: Family Medicine

## 2019-06-29 ENCOUNTER — Other Ambulatory Visit: Payer: Self-pay

## 2019-06-29 VITALS — BP 143/82 | HR 96 | Temp 96.8°F | Wt 285.8 lb

## 2019-06-29 DIAGNOSIS — J1282 Pneumonia due to coronavirus disease 2019: Secondary | ICD-10-CM | POA: Diagnosis not present

## 2019-06-29 DIAGNOSIS — U071 COVID-19: Secondary | ICD-10-CM | POA: Diagnosis not present

## 2019-07-04 ENCOUNTER — Other Ambulatory Visit: Payer: Self-pay | Admitting: Internal Medicine

## 2019-07-04 DIAGNOSIS — J1282 Pneumonia due to coronavirus disease 2019: Secondary | ICD-10-CM

## 2019-07-18 ENCOUNTER — Other Ambulatory Visit: Payer: Self-pay | Admitting: Internal Medicine

## 2019-11-21 ENCOUNTER — Ambulatory Visit: Payer: Commercial Managed Care - PPO | Admitting: Physician Assistant

## 2020-06-19 ENCOUNTER — Emergency Department
Admission: EM | Admit: 2020-06-19 | Discharge: 2020-06-19 | Disposition: A | Discharge status: Home / Self Care | Payer: Managed Care, Other (non HMO) | Attending: Emergency Medicine | Admitting: Emergency Medicine

## 2020-06-19 ENCOUNTER — Other Ambulatory Visit: Payer: Self-pay

## 2020-06-19 DIAGNOSIS — F17220 Nicotine dependence, chewing tobacco, uncomplicated: Secondary | ICD-10-CM | POA: Diagnosis not present

## 2020-06-19 DIAGNOSIS — S6992XA Unspecified injury of left wrist, hand and finger(s), initial encounter: Secondary | ICD-10-CM | POA: Diagnosis present

## 2020-06-19 DIAGNOSIS — W260XXA Contact with knife, initial encounter: Secondary | ICD-10-CM | POA: Insufficient documentation

## 2020-06-19 DIAGNOSIS — S61412A Laceration without foreign body of left hand, initial encounter: Secondary | ICD-10-CM | POA: Diagnosis not present

## 2020-06-19 DIAGNOSIS — Z23 Encounter for immunization: Secondary | ICD-10-CM | POA: Diagnosis not present

## 2020-06-19 MED ORDER — TETANUS-DIPHTH-ACELL PERTUSSIS 5-2.5-18.5 LF-MCG/0.5 IM SUSY
0.5000 mL | PREFILLED_SYRINGE | Freq: Once | INTRAMUSCULAR | Status: AC
  Administered 2020-06-19: 0.5 mL via INTRAMUSCULAR
  Filled 2020-06-19: qty 0.5

## 2020-06-19 MED ORDER — CEPHALEXIN 500 MG PO CAPS: 1000.0000 mg | ORAL_CAPSULE | Freq: Two times a day (BID) | ORAL | 0 refills | Status: AC

## 2020-06-19 NOTE — ED Notes (Signed)
Patient discharged to home per MD order. Patient in stable condition, and deemed medically cleared by ED provider for discharge. Discharge instructions reviewed with patient/family using "Teach Back"; verbalized understanding of medication education and administration, and information about follow-up care. Denies further concerns. ° °

## 2020-06-19 NOTE — ED Triage Notes (Signed)
Pt was using knife tonight and knife slipped and cut pt in betweenleft thumb and index finger. Bleeding controlled at this time

## 2020-06-19 NOTE — ED Provider Notes (Signed)
[facility] Emergency Department Provider Note  ____________________________________________  Time seen: Approximately 9:53 PM  I have reviewed the triage vital signs and the nursing notes.   HISTORY  Chief Complaint Laceration    HPI Jared Craig is a 45 y.o. male who presents the emergency department complaining of a laceration between the thumb and index finger.  Patient was using a knife when it slipped causing a small laceration in interdigital space between the thumb and index finger of the left hand.  Unsure of last tetanus shot.  No active bleeding.  No other appreciable injury at this time.         Past Medical History:  Diagnosis Date  . Heart murmur    as a child    Patient Active Problem List   Diagnosis Date Noted  . Acute respiratory failure with hypoxia (HCC)   . Pneumonia due to COVID-19 virus 06/19/2019  . Obesity 03/22/2017  . Elevated hemoglobin A1c 03/22/2017  . Elevated cholesterol with elevated triglycerides 03/22/2017    Past Surgical History:  Procedure Laterality Date  . COLONOSCOPY      Prior to Admission medications   Medication Sig Start Date End Date Taking? Authorizing Provider  cephALEXin (KEFLEX) 500 MG capsule Take 2 capsules (1,000 mg total) by mouth 2 (two) times daily. 06/19/20  Yes Syria Kestner, Delorise Royals, PA-C  ascorbic acid (VITAMIN C) 500 MG tablet Take 1 tablet (500 mg total) by mouth daily. 06/23/19   Delfino Lovett, MD  zinc sulfate 220 (50 Zn) MG capsule Take 1 capsule (220 mg total) by mouth daily. 06/23/19   Delfino Lovett, MD    Allergies Patient has no known allergies.  Family History  Problem Relation Age of Onset  . COPD Mother   . Heart attack Father   . Colon cancer Maternal Grandmother   . Heart attack Paternal Grandmother     Social History Social History   Tobacco Use  . Smoking status: Never Smoker  . Smokeless tobacco: Current User    Types: Chew  Substance Use Topics  .  Alcohol use: Yes    Comment: occasionally   . Drug use: No     Review of Systems  Constitutional: No fever/chills Eyes: No visual changes. No discharge ENT: No upper respiratory complaints. Cardiovascular: no chest pain. Respiratory: no cough. No SOB. Gastrointestinal: No abdominal pain.  No nausea, no vomiting.  No diarrhea.  No constipation. Musculoskeletal: Positive for left hand laceration Skin: Negative for rash, abrasions, lacerations, ecchymosis. Neurological: Negative for headaches, focal weakness or numbness.  10 System ROS otherwise negative.  ____________________________________________   PHYSICAL EXAM:  VITAL SIGNS: ED Triage Vitals  Enc Vitals Group     BP 06/19/20 2122 (!) 146/90     Pulse Rate 06/19/20 2122 (!) 103     Resp 06/19/20 2122 18     Temp 06/19/20 2122 99.2 F (37.3 C)     Temp Source 06/19/20 2122 Oral     SpO2 06/19/20 2122 95 %     Weight 06/19/20 2122 280 lb (127 kg)     Height 06/19/20 2122 5\' 6"  (1.676 m)     Head Circumference --      Peak Flow --      Pain Score 06/19/20 2127 3     Pain Loc --      Pain Edu? --      Excl. in GC? --      Constitutional: Alert and oriented. Well appearing  and in no acute distress. Eyes: Conjunctivae are normal. PERRL. EOMI. Head: Atraumatic. ENT:      Ears:       Nose: No congestion/rhinnorhea.      Mouth/Throat: Mucous membranes are moist.  Neck: No stridor.    Cardiovascular: Normal rate, regular rhythm. Normal S1 and S2.  Good peripheral circulation. Respiratory: Normal respiratory effort without tachypnea or retractions. Lungs CTAB. Good air entry to the bases with no decreased or absent breath sounds. Musculoskeletal: Full range of motion to all extremities. No gross deformities appreciated.  Visualization of the left hand reveals a small laceration between the interdigital space of the thumb and index finger.  Measures approximately 1.5 cm in length.  No active bleeding.  No visible foreign  body.  Full range motion to the digits of the thumb and index finger.  Sensation and capillary refill intact. Neurologic:  Normal speech and language. No gross focal neurologic deficits are appreciated.  Skin:  Skin is warm, dry and intact. No rash noted. Psychiatric: Mood and affect are normal. Speech and behavior are normal. Patient exhibits appropriate insight and judgement.   ____________________________________________   LABS (all labs ordered are listed, but only abnormal results are displayed)  Labs Reviewed - No data to display ____________________________________________  EKG   ____________________________________________  RADIOLOGY   No results found.  ____________________________________________    PROCEDURES  Procedure(s) performed:    Marland KitchenMarland KitchenLaceration Repair  Date/Time: 06/19/2020 11:32 PM Performed by: Racheal Patches, PA-C Authorized by: Racheal Patches, PA-C   Consent:    Consent obtained:  Verbal   Consent given by:  Patient   Risks discussed:  Pain Universal protocol:    Procedure explained and questions answered to patient or proxy's satisfaction: yes     Immediately prior to procedure, a time out was called: yes     Patient identity confirmed:  Verbally with patient Anesthesia:    Anesthesia method:  Local infiltration   Local anesthetic:  Lidocaine 1% w/o epi Laceration details:    Location:  Hand   Hand location:  L palm   Length (cm):  3 Exploration:    Hemostasis achieved with:  Direct pressure   Imaging outcome: foreign body not noted     Wound extent: no foreign bodies/material noted, no nerve damage noted and no tendon damage noted     Contaminated: no   Treatment:    Area cleansed with:  Povidone-iodine and saline   Amount of cleaning:  Extensive   Irrigation solution:  Sterile saline   Irrigation volume:  1L   Irrigation method:  Syringe Skin repair:    Repair method:  Sutures   Suture size:  4-0   Suture  material:  Nylon   Suture technique:  Running locked   Number of sutures:  1 (1 running interlocked suture with 8 throws) Approximation:    Approximation:  Close Repair type:    Repair type:  Simple Post-procedure details:    Dressing:  Open (no dressing)   Procedure completion:  Tolerated well, no immediate complications      Medications  Tdap (BOOSTRIX) injection 0.5 mL (0.5 mLs Intramuscular Given 06/19/20 2330)     ____________________________________________   INITIAL IMPRESSION / ASSESSMENT AND PLAN / ED COURSE  Pertinent labs & imaging results that were available during my care of the patient were reviewed by me and considered in my medical decision making (see chart for details).  Review of the  [place]RS was performed in accordance of  the [facility] prior to dispensing any controlled drugs.           Patient's diagnosis is consistent with an laceration.  Patient presented to emergency department after sustaining a laceration in interdigital space between the thumb and index finger.  Patient had good range of motion intact.  Sensation capillary refill intact.  Tetanus shot was updated tonight.  Wound was closed as described above.  Wound care instructions discussed with the patient.  Follow-up primary care in 1 week for suture removal..  Antibiotics prophylactically.  Tylenol Motrin for any pain.  Patient is given ED precautions to return to the ED for any worsening or new symptoms.     ____________________________________________  FINAL CLINICAL IMPRESSION(S) / ED DIAGNOSES  Final diagnoses:  Laceration of left hand without foreign body, initial encounter      NEW MEDICATIONS STARTED DURING THIS VISIT:  ED Discharge Orders         Ordered    cephALEXin (KEFLEX) 500 MG capsule  2 times daily        06/19/20 2328              This chart was dictated using voice recognition software/Dragon. Despite best efforts to proofread, errors can occur which can  change the meaning. Any change was purely unintentional.    Racheal Patches, PA-C 06/20/20 0024    Jene Every, MD 06/24/20 [phone]

## 2020-06-19 NOTE — ED Notes (Signed)
Dressing applied to left hand 

## 2021-12-03 IMAGING — CT CT ANGIO CHEST
2 of 6 series · 18 of 46 positions shown · IV contrast (APPLIED)
Comparison: None.

CLINICAL DATA: Respiratory distress.

EXAM:
CT ANGIOGRAPHY CHEST WITH CONTRAST
TECHNIQUE: Multidetector CT imaging of the chest was performed using the
standard protocol during bolus administration of intravenous
contrast. Multiplanar CT image reconstructions and MIPs were
obtained to evaluate the vascular anatomy.
CONTRAST:  75mL OMNIPAQUE IOHEXOL 350 MG/ML SOLN

[Series 5: thins · axial · 0.76mm/px · z∈[-580,-341]mm · 15 of 263 slices shown]
[im 12/263  lung]
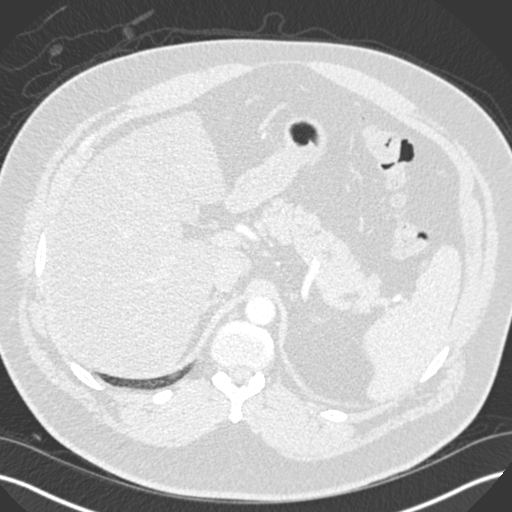
[im 35/263  soft-tissue]
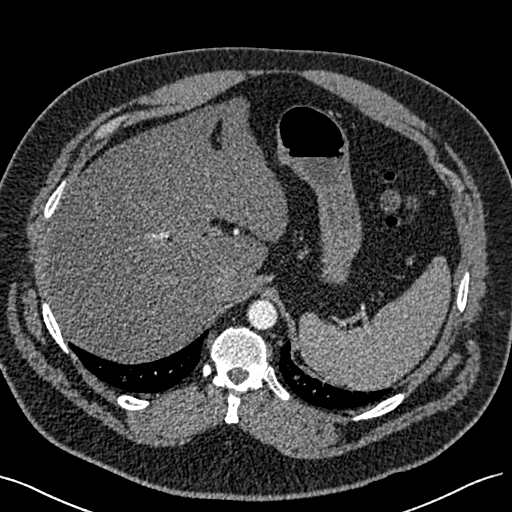
[im 46/263  lung]
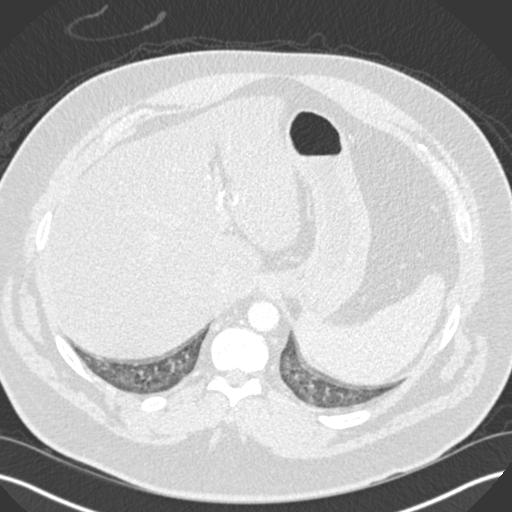
[im 69/263  soft-tissue]
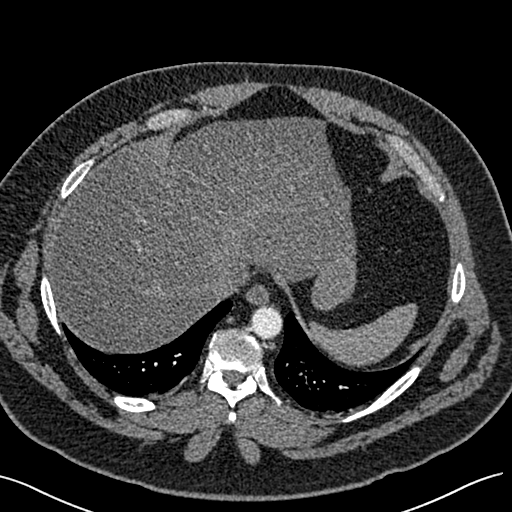
[im 80/263  lung]
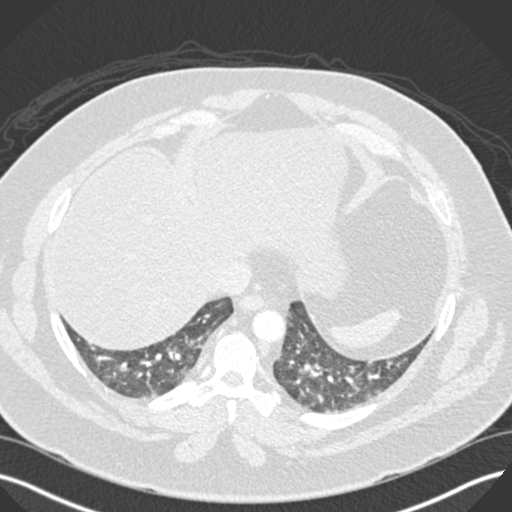
[im 103/263  soft-tissue]
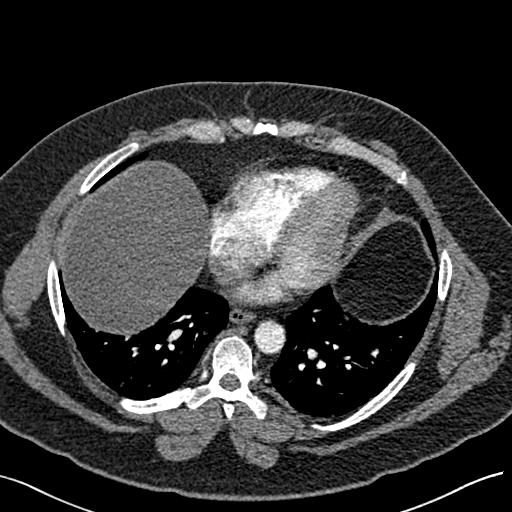
[im 114/263  lung]
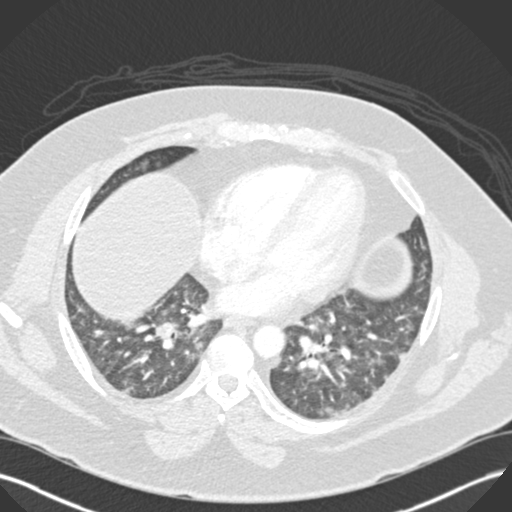
[im 137/263  soft-tissue]
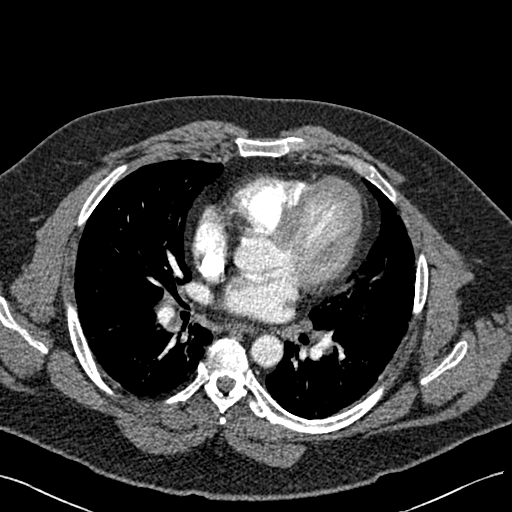
[im 149/263  lung]
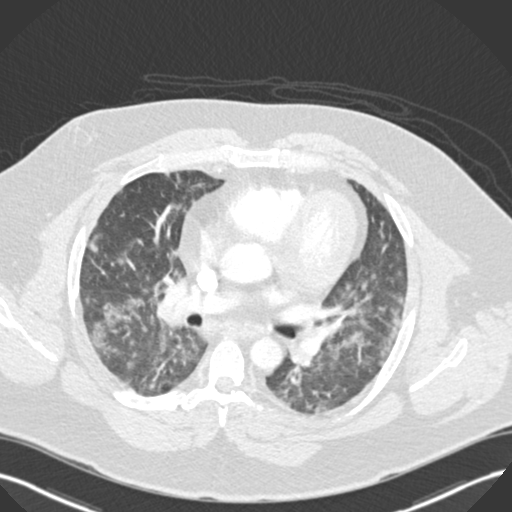
[im 160/263  soft-tissue]
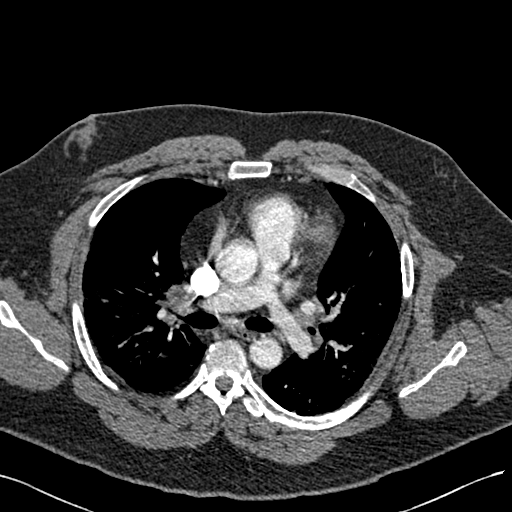
[im 183/263  lung]
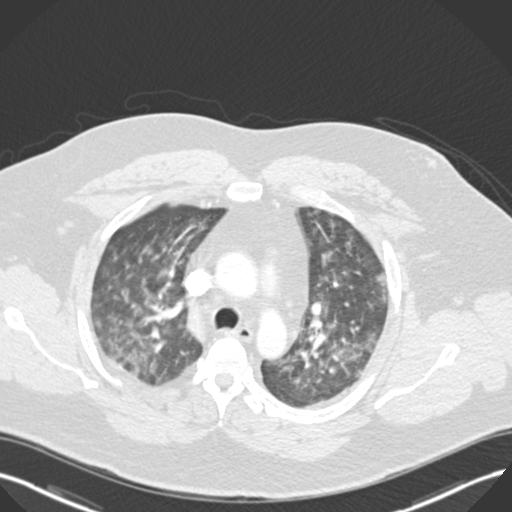
[im 194/263  soft-tissue]
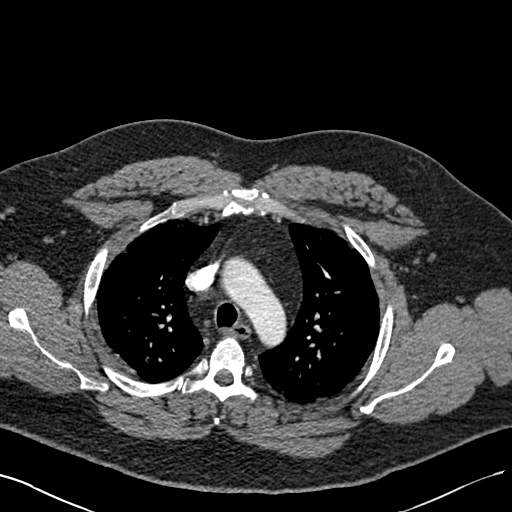
[im 217/263  lung]
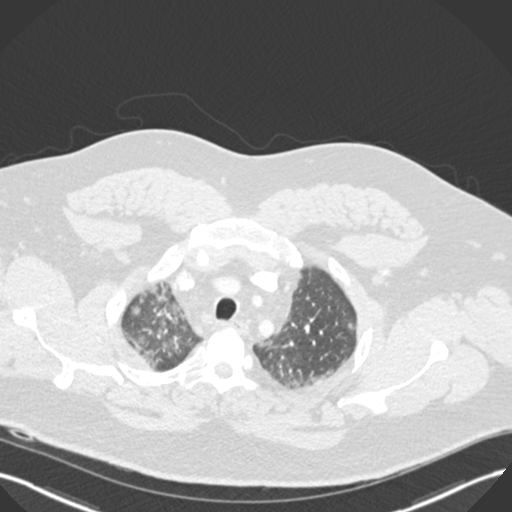
[im 228/263  soft-tissue]
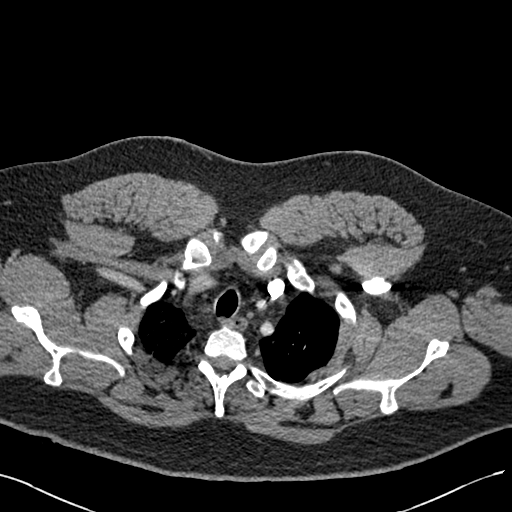
[im 251/263  lung]
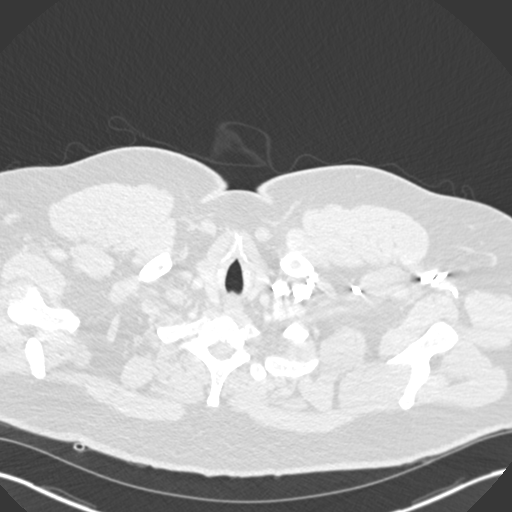

[Series 7: coronal mpr · coronal · 0.51mm/px · 3 of 90 slices shown]
[im 23/90  soft-tissue]
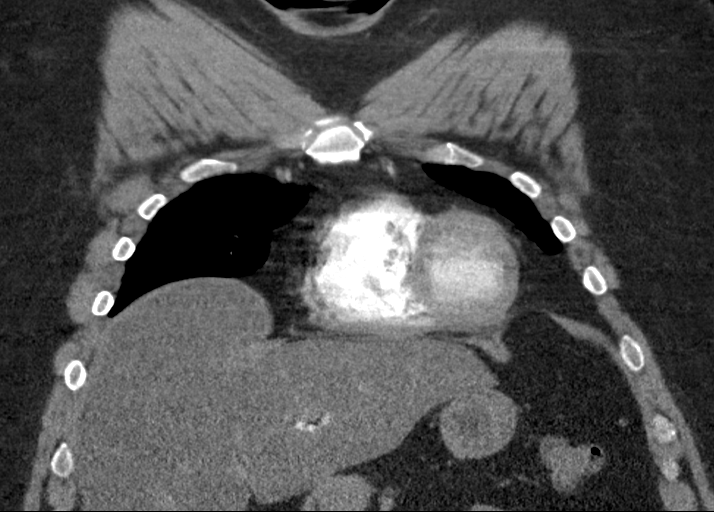
[im 45/90  soft-tissue]
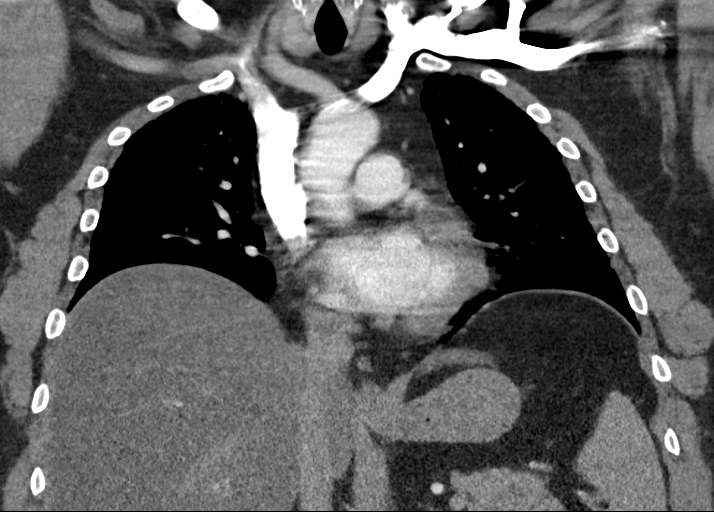
[im 67/90  soft-tissue]
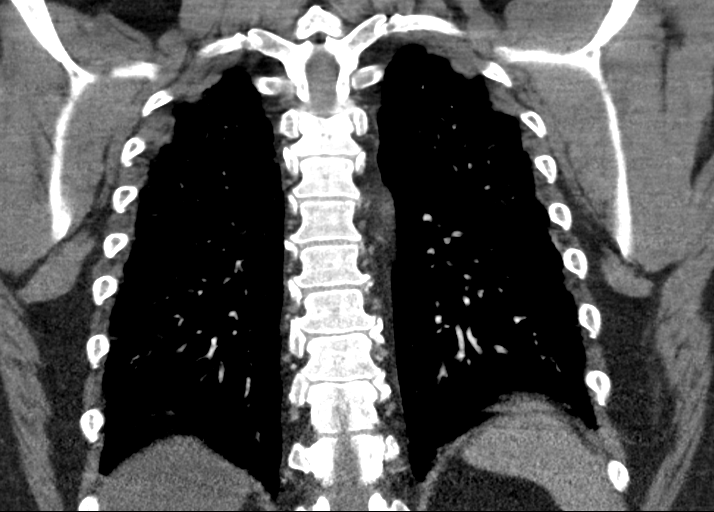

[18 of 46 positions shown; findings below may reference images not displayed]

FINDINGS: Cardiovascular: Satisfactory opacification of the pulmonary arteries
to the segmental level. No evidence of pulmonary embolism. Normal
heart size. No pericardial effusion.

Mediastinum/Nodes: There is mild bilateral hilar lymphadenopathy,
right greater than left.

Lungs/Pleura: Moderate to marked severity diffuse slightly nodular
appearing infiltrates are seen throughout both lungs.

There is no evidence of a pleural effusion or pneumothorax.

Upper Abdomen: No acute abnormality.

Musculoskeletal: No chest wall abnormality. No acute or significant
osseous findings.

Review of the MIP images confirms the above findings.
IMPRESSION: 1. Moderate to marked severity diffuse bilateral infiltrates.
2. Mild bilateral hilar lymphadenopathy, right greater than left.
3. No evidence of pulmonary embolism.
